# Patient Record
Sex: Female | Born: 1965 | Race: White | Hispanic: No | Marital: Married | Smoking: Never smoker
Health system: Southern US, Community
[De-identification: ages and names within clinical notes are randomized; demographics above are authoritative.]

## PROBLEM LIST (undated history)

## (undated) DIAGNOSIS — C50919 Malignant neoplasm of unspecified site of unspecified female breast: Secondary | ICD-10-CM

## (undated) HISTORY — PX: CHOLECYSTECTOMY: SHX55

## (undated) HISTORY — PX: APPENDECTOMY: SHX54

---

## 2021-11-17 ENCOUNTER — Emergency Department (HOSPITAL_COMMUNITY)
Admission: EM | Admit: 2021-11-17 | Discharge: 2021-11-17 | Disposition: A | Discharge status: Home / Self Care | Payer: BC Managed Care – PPO | Attending: Emergency Medicine | Admitting: Emergency Medicine

## 2021-11-17 ENCOUNTER — Emergency Department (HOSPITAL_COMMUNITY): Payer: BC Managed Care – PPO

## 2021-11-17 ENCOUNTER — Encounter (HOSPITAL_COMMUNITY): Payer: Self-pay | Admitting: Emergency Medicine

## 2021-11-17 DIAGNOSIS — D649 Anemia, unspecified: Secondary | ICD-10-CM | POA: Insufficient documentation

## 2021-11-17 DIAGNOSIS — K922 Gastrointestinal hemorrhage, unspecified: Secondary | ICD-10-CM | POA: Diagnosis not present

## 2021-11-17 DIAGNOSIS — J0100 Acute maxillary sinusitis, unspecified: Secondary | ICD-10-CM | POA: Diagnosis not present

## 2021-11-17 DIAGNOSIS — R519 Headache, unspecified: Secondary | ICD-10-CM

## 2021-11-17 HISTORY — DX: Malignant neoplasm of unspecified site of unspecified female breast: C50.919

## 2021-11-17 LAB — BASIC METABOLIC PANEL
Anion gap: 7 (ref 5–15)
BUN: 9 mg/dL (ref 6–20)
CO2: 25 mmol/L (ref 22–32)
Calcium: 9.2 mg/dL (ref 8.9–10.3)
Chloride: 108 mmol/L (ref 98–111)
Creatinine, Ser: 0.84 mg/dL (ref 0.44–1.00)
GFR, Estimated: >60 mL/min (ref >60)
Glucose, Bld: 100 mg/dL — ABNORMAL HIGH (ref 70–99)
Potassium: 3.8 mmol/L (ref 3.5–5.1)
Sodium: 140 mmol/L (ref 135–145)

## 2021-11-17 LAB — SEDIMENTATION RATE: Sed Rate: 9 mm/hr (ref 0–22)

## 2021-11-17 LAB — TYPE AND SCREEN
ABO/RH(D): O NEG
Antibody Screen: NEGATIVE

## 2021-11-17 LAB — CBC WITH DIFFERENTIAL/PLATELET
Abs Immature Granulocytes: 0.01 10*3/uL (ref 0.00–0.07)
Abs Immature Granulocytes: 0.01 10*3/uL (ref 0.00–0.07)
Basophils Absolute: 0.1 10*3/uL (ref 0.0–0.1)
Basophils Absolute: 0.1 10*3/uL (ref 0.0–0.1)
Basophils Relative: 2 %
Basophils Relative: 1 %
Eosinophils Absolute: 0.1 10*3/uL (ref 0.0–0.5)
Eosinophils Absolute: 0.1 10*3/uL (ref 0.0–0.5)
Eosinophils Relative: 4 %
Eosinophils Relative: 4 %
HCT: 23.7 % — ABNORMAL LOW (ref 36.0–46.0)
HCT: 24.9 % — ABNORMAL LOW (ref 36.0–46.0)
Hemoglobin: 6.4 g/dL — CL (ref 12.0–15.0)
Hemoglobin: 6.6 g/dL — CL (ref 12.0–15.0)
Immature Granulocytes: 0 %
Immature Granulocytes: 0 %
Lymphocytes Relative: 48 %
Lymphocytes Relative: 50 %
Lymphs Abs: 1.5 10*3/uL (ref 0.7–4.0)
Lymphs Abs: 1.8 10*3/uL (ref 0.7–4.0)
MCH: 17.7 pg — ABNORMAL LOW (ref 26.0–34.0)
MCH: 17.5 pg — ABNORMAL LOW (ref 26.0–34.0)
MCHC: 27 g/dL — ABNORMAL LOW (ref 30.0–36.0)
MCHC: 26.5 g/dL — ABNORMAL LOW (ref 30.0–36.0)
MCV: 65.7 fL — ABNORMAL LOW (ref 80.0–100.0)
MCV: 65.9 fL — ABNORMAL LOW (ref 80.0–100.0)
Monocytes Absolute: 0.3 10*3/uL (ref 0.1–1.0)
Monocytes Absolute: 0.3 10*3/uL (ref 0.1–1.0)
Monocytes Relative: 9 %
Monocytes Relative: 8 %
Neutro Abs: 1.2 10*3/uL — ABNORMAL LOW (ref 1.7–7.7)
Neutro Abs: 1.3 10*3/uL — ABNORMAL LOW (ref 1.7–7.7)
Neutrophils Relative %: 37 %
Neutrophils Relative %: 37 %
Platelets: 413 10*3/uL — ABNORMAL HIGH (ref 150–400)
Platelets: 417 10*3/uL — ABNORMAL HIGH (ref 150–400)
RBC: 3.61 MIL/uL — ABNORMAL LOW (ref 3.87–5.11)
RBC: 3.78 MIL/uL — ABNORMAL LOW (ref 3.87–5.11)
RDW: 19.9 % — ABNORMAL HIGH (ref 11.5–15.5)
RDW: 19.9 % — ABNORMAL HIGH (ref 11.5–15.5)
WBC: 3.2 10*3/uL — ABNORMAL LOW (ref 4.0–10.5)
WBC: 3.6 10*3/uL — ABNORMAL LOW (ref 4.0–10.5)
nRBC: 0 % (ref 0.0–0.2)
nRBC: 0 % (ref 0.0–0.2)

## 2021-11-17 LAB — RETICULOCYTES
Immature Retic Fract: 7.7 % (ref 2.3–15.9)
RBC.: 3.78 MIL/uL — ABNORMAL LOW (ref 3.87–5.11)
Retic Count, Absolute: 48.4 10*3/uL (ref 19.0–186.0)
Retic Ct Pct: 1.3 % (ref 0.4–3.1)

## 2021-11-17 LAB — C-REACTIVE PROTEIN: CRP: 0.6 mg/dL (ref <1.0)

## 2021-11-17 LAB — POC OCCULT BLOOD, ED: Fecal Occult Bld: POSITIVE — AB

## 2021-11-17 LAB — IRON AND TIBC
Iron: 8 ug/dL — ABNORMAL LOW (ref 28–170)
Saturation Ratios: 2 % — ABNORMAL LOW (ref 10.4–31.8)
TIBC: 451 ug/dL — ABNORMAL HIGH (ref 250–450)
UIBC: 443 ug/dL

## 2021-11-17 LAB — FOLATE: Folate: 12.2 ng/mL (ref >5.9)

## 2021-11-17 LAB — VITAMIN B12: Vitamin B-12: 277 pg/mL (ref 180–914)

## 2021-11-17 LAB — ABO/RH: ABO/RH(D): O NEG

## 2021-11-17 LAB — FERRITIN: Ferritin: 2 ng/mL — ABNORMAL LOW (ref 11–307)

## 2021-11-17 MED ORDER — AMOXICILLIN-POT CLAVULANATE 875-125 MG PO TABS: 1.0000 | ORAL_TABLET | Freq: Two times a day (BID) | ORAL | 0 refills | Status: DC

## 2021-11-17 MED ORDER — AMOXICILLIN-POT CLAVULANATE 875-125 MG PO TABS
1.0000 | ORAL_TABLET | Freq: Once | ORAL | Status: AC
  Administered 2021-11-17: 1 via ORAL
  Filled 2021-11-17: qty 1

## 2021-11-17 MED ORDER — SODIUM CHLORIDE 0.9 % IV SOLN
Freq: Once | INTRAVENOUS | Status: AC
Start: 2021-11-17 — End: 2021-11-17

## 2021-11-17 MED ORDER — PANTOPRAZOLE SODIUM 20 MG PO TBEC: 20.0000 mg | DELAYED_RELEASE_TABLET | Freq: Every day | ORAL | 0 refills | Status: AC

## 2021-11-17 MED ORDER — PANTOPRAZOLE SODIUM 20 MG PO TBEC
20.0000 mg | DELAYED_RELEASE_TABLET | Freq: Once | ORAL | Status: AC
Start: 2021-11-17 — End: 2021-11-17
  Administered 2021-11-17: 20 mg via ORAL
  Filled 2021-11-17: qty 1

## 2021-11-17 MED ORDER — IOHEXOL 350 MG/ML SOLN
75.0000 mL | Freq: Once | INTRAVENOUS | Status: AC | PRN
  Administered 2021-11-17: 75 mL via INTRAVENOUS

## 2021-11-17 NOTE — ED Provider Notes (Signed)
?[facility] ?Provider Note ? ? ?CSN: [id number] ?Arrival date & time: 11/17/21  1026 ? ?  ? ?History ? ?Chief Complaint  ?Patient presents with  ? Headache  ? ? ?Emma Rios is a 56 y.o. female. ? ?HPI ?Has history of treated breast cancer without recurrence.  She is otherwise healthy.  Patient which she has been having a pain in her head for several weeks.  She does not call it a headache but reports if she coughs or bends over, she will get a severe pain throughout her head.  Feels like a pressure.  It does not localize.  She denies any other associated symptoms.  Patient reports she has been taking ibuprofen for the headache.  Typically she takes no occasionally although over the past week she thinks she took more doses.  She denies any visual changes.  No blurred vision or double vision.  She denies any fevers or chills.  No sinus drainage.  No sore throat.  No neck stiffness.  Fevers no chills no malaise.  Patient reports she can do her usual activities without shortness of breath, weakness or incoordination.  No vomiting or diarrhea.  No chest pain.  Patient denies her bowel movement is dark or tarry.  No pain burning urgency with urination. ? ?Denies medical problems but does describe reflux symptoms and is scheduled for a screening colonoscopy as well as an upper endoscopy within the next 2 weeks. ?  ? ?Home Medications ?Prior to Admission medications   ?Medication Sig Start Date End Date Taking? Authorizing Provider  ?albuterol (VENTOLIN HFA) 108 (90 Base) MCG/ACT inhaler Inhale 2 puffs into the lungs every 6 (six) hours as needed for wheezing or shortness of breath. 10/07/21  Yes [provider]  ?amoxicillin-clavulanate (AUGMENTIN) 875-125 MG tablet Take 1 tablet by mouth every 12 (twelve) hours. 11/17/21  Yes Charlesetta Shanks, MD  ?cetirizine (ZYRTEC) 10 MG tablet Take 10 mg by mouth daily as needed for allergies or rhinitis.   Yes [provider]   ?pantoprazole (PROTONIX) 20 MG tablet Take 1 tablet (20 mg total) by mouth daily. 11/17/21  Yes Charlesetta Shanks, MD  ?pseudoephedrine (SUDAFED) 30 MG tablet Take 30 mg by mouth every 4 (four) hours as needed for congestion.   Yes [provider]  ?   ? ?Allergies    ?Codeine   ? ?Review of Systems   ?Review of Systems ?10 systems reviewed and negative except as per HPI ?Physical Exam ?Updated Vital Signs ?BP (!) 147/90   Pulse 86   Temp 98 ?F (36.7 ?C) (Oral)   Resp 15   LMP  (LMP Unknown)   SpO2 100%  ?Physical Exam ?Constitutional:   ?   Comments: Alert nontoxic and well in appearance.  Well-nourished well-developed.  ?HENT:  ?   Head: Normocephalic and atraumatic.  ?   Comments: No facial swelling. ?   Mouth/Throat:  ?   Mouth: Mucous membranes are moist.  ?   Pharynx: Oropharynx is clear.  ?Eyes:  ?   Extraocular Movements: Extraocular movements intact.  ?   Conjunctiva/sclera: Conjunctivae normal.  ?   Pupils: Pupils are equal, round, and reactive to light.  ?Cardiovascular:  ?   Rate and Rhythm: Normal rate and regular rhythm.  ?Pulmonary:  ?   Effort: Pulmonary effort is normal.  ?   Breath sounds: Normal breath sounds.  ?Abdominal:  ?   General: There is no distension.  ?   Palpations: Abdomen  is soft.  ?   Tenderness: There is no abdominal tenderness. There is no guarding.  ?Genitourinary: ?   Comments: Rectal: Trace brown stool in the vault.  No melena. ?Musculoskeletal:     ?   General: No swelling or tenderness. Normal range of motion.  ?   Cervical back: Neck supple.  ?   Right lower leg: No edema.  ?   Left lower leg: No edema.  ?Skin: ?   General: Skin is warm and dry.  ?Neurological:  ?   General: No focal deficit present.  ?   Mental Status: She is oriented to person, place, and time.  ?   Cranial Nerves: No cranial nerve deficit.  ?   Motor: No weakness.  ?   Coordination: Coordination normal.  ?Psychiatric:     ?   Mood and Affect: Mood normal.  ? ? ?ED Results / Procedures /  Treatments   ?Labs ?(all labs ordered are listed, but only abnormal results are displayed) ?Labs Reviewed  ?CBC WITH DIFFERENTIAL/PLATELET - Abnormal; Notable for the following components:  ?    Result Value  ? WBC 3.2 (*)   ? RBC 3.61 (*)   ? Hemoglobin 6.4 (*)   ? HCT 23.7 (*)   ? MCV 65.7 (*)   ? MCH 17.7 (*)   ? MCHC 27.0 (*)   ? RDW 19.9 (*)   ? Platelets 413 (*)   ? Neutro Abs 1.2 (*)   ? All other components within normal limits  ?BASIC METABOLIC PANEL - Abnormal; Notable for the following components:  ? Glucose, Bld 100 (*)   ? All other components within normal limits  ?CBC WITH DIFFERENTIAL/PLATELET - Abnormal; Notable for the following components:  ? WBC 3.6 (*)   ? RBC 3.78 (*)   ? Hemoglobin 6.6 (*)   ? HCT 24.9 (*)   ? MCV 65.9 (*)   ? MCH 17.5 (*)   ? MCHC 26.5 (*)   ? RDW 19.9 (*)   ? Platelets 417 (*)   ? Neutro Abs 1.3 (*)   ? All other components within normal limits  ?IRON AND TIBC - Abnormal; Notable for the following components:  ? Iron 8 (*)   ? TIBC 451 (*)   ? Saturation Ratios 2 (*)   ? All other components within normal limits  ?FERRITIN - Abnormal; Notable for the following components:  ? Ferritin 2 (*)   ? All other components within normal limits  ?RETICULOCYTES - Abnormal; Notable for the following components:  ? RBC. 3.78 (*)   ? All other components within normal limits  ?POC OCCULT BLOOD, ED - Abnormal; Notable for the following components:  ? Fecal Occult Bld POSITIVE (*)   ? All other components within normal limits  ?SEDIMENTATION RATE  ?C-REACTIVE PROTEIN  ?VITAMIN B12  ?FOLATE  ?TYPE AND SCREEN  ?ABO/RH  ? ? ?EKG ?None ? ?Radiology ?CT Angio Head W or Wo Contrast ? ?Result Date: 11/17/2021 ?CLINICAL DATA:  Headache, new or worsening. Symptoms began about 2 weeks ago. EXAM: CT ANGIOGRAPHY HEAD TECHNIQUE: Multidetector CT imaging of the head was performed using the standard protocol during bolus administration of intravenous contrast. Multiplanar CT image reconstructions and MIPs  were obtained to evaluate the vascular anatomy. RADIATION DOSE REDUCTION: This exam was performed according to the departmental dose-optimization program which includes automated exposure control, adjustment of the mA and/or kV according to patient size and/or use of iterative reconstruction technique. CONTRAST:  30m OMNIPAQUE IOHEXOL 350 MG/ML SOLN COMPARISON:  Head CT earlier same day FINDINGS: CTA HEAD Anterior circulation: Both internal carotid arteries are patent through the skull base and siphon regions. The anterior and middle cerebral vessels are patent and normal in appearance. No evidence of large vessel occlusion, aneurysm or stenosis. Posterior circulation: Both vertebral arteries are patent through the foramen magnum to the basilar. No basilar stenosis. Posterior circulation branch vessels are normal. Venous sinuses: Normal as seen.  See results of venography. Anatomic variants: None Near complete opacification of the right maxillary sinus as seen previously. Review of the MIP images confirms the above findings. IMPRESSION: Normal intracranial CT arterial angiography. Near complete opacification of the right maxillary sinus. Electronically Signed   By: MNelson ChimesM.D.   On: 11/17/2021 18:45  ? ?CT Head Wo Contrast ? ?Result Date: 11/17/2021 ?CLINICAL DATA:  Severe headache, history of breast carcinoma EXAM: CT HEAD WITHOUT CONTRAST TECHNIQUE: Contiguous axial images were obtained from the base of the skull through the vertex without intravenous contrast. RADIATION DOSE REDUCTION: This exam was performed according to the departmental dose-optimization program which includes automated exposure control, adjustment of the mA and/or kV according to patient size and/or use of iterative reconstruction technique. COMPARISON:  None Available. FINDINGS: Brain: No acute intracranial findings are seen. There are no signs of bleeding. There are no epidural or subdural fluid collections. Ventricles are not dilated.  There is no shift of midline structures. Vascular: Unremarkable. Skull: Unremarkable. Sinuses/Orbits: There is almost complete opacification of right maxillary sinus. There is mild mucosal thickening in the ethmoid sinus

## 2021-11-17 NOTE — Discharge Instructions (Signed)
1.  Take Augmentin twice daily as prescribed for sinus infection.  Take Protonix every day.  Do not take any anti-inflammatory medications in the category of NSAIDs or aspirin.  If you need something for pain take extra strength Tylenol every 6 hours.  You may choose an over-the-counter iron supplement to take daily. ?2.  Call Dr.Feng's office tomorrow morning to schedule your follow-up appointment within the next 1 to 5 days. ?3.  You have very low blood count.  Return to the emergency department immediately if you feel short of breath, lightheaded, your stool looks black or cranberry colored or any other concerning changes. ?

## 2021-11-17 NOTE — ED Provider Triage Note (Signed)
Emergency Medicine Provider Triage Evaluation Note ? ?Emma Rios , a 56 y.o. female  was evaluated in triage.  Pt complains of a global headache primarily with movement of the head and with bearing down for the last 2 weeks. Patient does carry a history of breast cancer 15 years ago that was treated with chemo and radiation. Patient has no headache when sitting still.  ? ?Review of Systems  ?Positive: Headache  ?Negative: Nausea, vomiting, fever, chills, focal weakness or numbness  ? ?Physical Exam  ?BP (!) 137/95 (BP Location: Right Arm)   Pulse 94   Temp 98 ?F (36.7 ?C) (Oral)   Resp 16   LMP  (LMP Unknown)   SpO2 100%  ?Gen:   Awake, no distress   ?Resp:  Normal effort  ?MSK:   Moves extremities without difficulty  ?Other:  5/5 strength to the upper and lower extremities. Normal sensation to the upper and lower extremities. No pronator drift. PERRL. EOMI without nystagmus or entrapment. No dysmetria with finger to nose.  ? ?Medical Decision Making  ?Medically screening exam initiated at 10:50 AM.  Appropriate orders placed.  Emma Rios was informed that the remainder of the evaluation will be completed by another provider, this initial triage assessment does not replace that evaluation, and the importance of remaining in the ED until their evaluation is complete. ? ? ?  ?Hendricks Limes, PA-C ?11/17/21 1053 ? ?

## 2021-11-17 NOTE — ED Notes (Signed)
Lab called critical result HGB 6.6. md notified ? ?

## 2021-11-17 NOTE — ED Triage Notes (Signed)
Pt. Stated, about 2 weeks I started having HEAD pain not headache. When I I cough or jar my head it will be a 8-9. Im a breast cancer survivor. ?

## 2021-11-18 ENCOUNTER — Telehealth: Payer: Self-pay | Admitting: Nurse Practitioner

## 2021-11-18 ENCOUNTER — Other Ambulatory Visit: Payer: Self-pay | Admitting: Hematology

## 2021-11-18 DIAGNOSIS — D500 Iron deficiency anemia secondary to blood loss (chronic): Secondary | ICD-10-CM

## 2021-11-18 NOTE — Telephone Encounter (Signed)
Scheduled appt per 5/5 staff msg from Dr. Burr Medico. Emma Rios is aware of appt date and time. Emma Rios is aware to arrive 15 mins prior to appt time and to bring and updated insurance card. Emma Rios is aware of appt location.   ?

## 2021-11-23 ENCOUNTER — Inpatient Hospital Stay: Payer: BC Managed Care – PPO | Attending: Nurse Practitioner | Admitting: Hematology

## 2021-11-23 ENCOUNTER — Inpatient Hospital Stay: Payer: BC Managed Care – PPO

## 2021-11-23 ENCOUNTER — Other Ambulatory Visit: Payer: Self-pay

## 2021-11-23 ENCOUNTER — Encounter: Payer: Self-pay | Admitting: Hematology

## 2021-11-23 VITALS — BP 113/80 | HR 78 | Temp 97.9°F | Resp 18

## 2021-11-23 VITALS — BP 132/86 | HR 85 | Temp 97.5°F | Resp 19 | Ht 66.5 in | Wt 154.9 lb

## 2021-11-23 DIAGNOSIS — R059 Cough, unspecified: Secondary | ICD-10-CM | POA: Insufficient documentation

## 2021-11-23 DIAGNOSIS — Z853 Personal history of malignant neoplasm of breast: Secondary | ICD-10-CM | POA: Insufficient documentation

## 2021-11-23 DIAGNOSIS — Z9049 Acquired absence of other specified parts of digestive tract: Secondary | ICD-10-CM | POA: Diagnosis not present

## 2021-11-23 DIAGNOSIS — D500 Iron deficiency anemia secondary to blood loss (chronic): Secondary | ICD-10-CM

## 2021-11-23 DIAGNOSIS — D509 Iron deficiency anemia, unspecified: Secondary | ICD-10-CM | POA: Insufficient documentation

## 2021-11-23 DIAGNOSIS — Z885 Allergy status to narcotic agent status: Secondary | ICD-10-CM | POA: Diagnosis not present

## 2021-11-23 DIAGNOSIS — Z79899 Other long term (current) drug therapy: Secondary | ICD-10-CM | POA: Insufficient documentation

## 2021-11-23 DIAGNOSIS — Z923 Personal history of irradiation: Secondary | ICD-10-CM | POA: Diagnosis not present

## 2021-11-23 DIAGNOSIS — Z8042 Family history of malignant neoplasm of prostate: Secondary | ICD-10-CM | POA: Diagnosis not present

## 2021-11-23 DIAGNOSIS — R519 Headache, unspecified: Secondary | ICD-10-CM | POA: Diagnosis not present

## 2021-11-23 DIAGNOSIS — Z809 Family history of malignant neoplasm, unspecified: Secondary | ICD-10-CM | POA: Insufficient documentation

## 2021-11-23 MED ORDER — SODIUM CHLORIDE 0.9 % IV SOLN
300.0000 mg | Freq: Once | INTRAVENOUS | Status: AC
  Administered 2021-11-23: 300 mg via INTRAVENOUS
  Filled 2021-11-23: qty 300

## 2021-11-23 MED ORDER — LORATADINE 10 MG PO TABS
10.0000 mg | ORAL_TABLET | Freq: Every day | ORAL | Status: DC
  Administered 2021-11-23: 10 mg via ORAL
  Filled 2021-11-23: qty 1

## 2021-11-23 MED ORDER — SODIUM CHLORIDE 0.9 % IV SOLN: Freq: Once | INTRAVENOUS | Status: AC

## 2021-11-23 NOTE — Progress Notes (Addendum)
[facility]   [phone] [phone]   Clinic New consult Note   Patient Care Team: Pcp, No as PCP - General Malachy Mood, MD as Consulting Physician (Hematology) 11/23/2021  CHIEF COMPLAINTS/PURPOSE OF CONSULTATION:  Iron Deficient Anemia  ASSESSMENT & PLAN:  Emma Rios is a 56 y.o. postmenopausal female with  1. Iron Deficient Anemia -she initially presented to ED on 11/17/21 with head pain triggered by movement. CBC showed hgb 6.4, iron 8, and ferritin 2. Fecal occult blood test was also positive. -she is coincidentally already scheduled for screening endo/colonoscopy with Dr. Iona Coach at [facility] on 11/30/21. -I discussed IV iron infusions for quick response to improve her blood counts. She is scheduled for first IV Venofer today, and we will give two more doses in next 2-3 weeks. -She is postmenopausal, if her endoscopy is negative, plan to order CT chest, abdomen pelvis, rule out malignancy.  2. H/o Left Breast Cancer in 2007, stage IIA T2, N0, IDC, grade 2 -s/p lumpectomy on 10/16/05, adjuvant chemo with taxol/cytoxan through 01/2007, adjuvant radiation, and 9 years of tamoxifen through 04/2016.   PLAN: -proceed with IV Venofer today -lab and IV Venofer 400mg  weekly x2 in next 2-3 weeks  -lab and f/u with NP in 3 weeks. If her GI endoscopy is negative, plan to order CT scan to rule out malignancy.   HISTORY OF PRESENTING ILLNESS:  Emma Rios 56 y.o. female is here because of iron deficient anemia. She initially presented with pain to her brain. She explains it's not a headache but is a pain that occurs when her brain is jostled (such as with coughing, bending over, etc). She denies neck pain. She notes a history of migraines, so she adds she knows what a headache feels like.  She was found to have abnormal CBC from 11/17/21. Prior CBC in 05/2020 was WNL. She denies recent chest pain on exertion, shortness of breath on minimal exertion, pre-syncopal  episodes, or palpitations. She had not noticed any recent bleeding such as epistaxis, hematuria or hematochezia The patient was previously using over-the-counter ibuprofen for her headaches until ED visit. She is not on anticoagulants. She is scheduled for endoscopy and colonoscopy with Dr. Iona Coach this month. She reports a history of breast cancer in 2008, treated at [facility]. She reports she completed 9 years of tamoxifen. She denies any pica and eats a variety of diet.   REVIEW OF SYSTEMS:   Constitutional: Denies fevers, chills or abnormal night sweats Eyes: Denies blurriness of vision, double vision or watery eyes Ears, nose, mouth, throat, and face: Denies mucositis or sore throat Respiratory: Denies cough, dyspnea or wheezes Cardiovascular: Denies palpitation, chest discomfort or lower extremity swelling Gastrointestinal:  Denies nausea, heartburn or change in bowel habits Skin: Denies abnormal skin rashes Lymphatics: Denies new lymphadenopathy or easy bruising Neurological:Denies numbness, tingling or new weaknesses Behavioral/Psych: Mood is stable, no new changes   All other systems were reviewed with the patient and are negative.   MEDICAL HISTORY:  Past Medical History:  Diagnosis Date   Breast cancer (HCC)     SURGICAL HISTORY: Past Surgical History:  Procedure Laterality Date   APPENDECTOMY     CHOLECYSTECTOMY      SOCIAL HISTORY: Social History   Socioeconomic History   Marital status: Married    Spouse name: Not on file   Number of children: 3   Years of education: Not on file   Highest education level: Not on file  Occupational History  Not on file  Tobacco Use   Smoking status: Never   Smokeless tobacco: Never  Substance and Sexual Activity   Alcohol use: Not Currently   Drug use: Not Currently   Sexual activity: Not on file  Other Topics Concern   Not on file  Social History Narrative   Not on file   Social Determinants of Health    Financial Resource Strain: Not on file  Food Insecurity: Not on file  Transportation Needs: Not on file  Physical Activity: Not on file  Stress: Not on file  Social Connections: Not on file  Intimate Partner Violence: Not on file    FAMILY HISTORY: Family History  Problem Relation Age of Onset   Cancer Maternal Grandmother        metastatic cancer   Cancer Paternal Grandfather        prostate cancer    ALLERGIES:  is allergic to codeine.  MEDICATIONS:  Current Outpatient Medications  Medication Sig Dispense Refill   albuterol (VENTOLIN HFA) 108 (90 Base) MCG/ACT inhaler Inhale 2 puffs into the lungs every 6 (six) hours as needed for wheezing or shortness of breath.     amoxicillin-clavulanate (AUGMENTIN) 875-125 MG tablet Take 1 tablet by mouth every 12 (twelve) hours. 14 tablet 0   cetirizine (ZYRTEC) 10 MG tablet Take 10 mg by mouth daily as needed for allergies or rhinitis.     pantoprazole (PROTONIX) 20 MG tablet Take 1 tablet (20 mg total) by mouth daily. 30 tablet 0   pseudoephedrine (SUDAFED) 30 MG tablet Take 30 mg by mouth every 4 (four) hours as needed for congestion.     No current facility-administered medications for this visit.    PHYSICAL EXAMINATION: ECOG PERFORMANCE STATUS: 1 - Symptomatic but completely ambulatory  Vitals:   11/23/21 1057  BP: 132/86  Pulse: 85  Resp: 19  Temp: (!) 97.5 F (36.4 C)  SpO2: 100%   Filed Weights   11/23/21 1057  Weight: 154 lb 14.4 oz (70.3 kg)    GENERAL:alert, no distress and comfortable SKIN: skin color, texture, turgor are normal, no rashes or significant lesions EYES: normal, conjunctiva are pink and non-injected, sclera clear OROPHARYNX:no exudate, no erythema and lips, buccal mucosa, and tongue normal  NECK: supple, thyroid normal size, non-tender, without nodularity LYMPH:  no palpable lymphadenopathy in the cervical, axillary or inguinal LUNGS: clear to auscultation and percussion with normal  breathing effort HEART: regular rate & rhythm and no murmurs and no lower extremity edema ABDOMEN:abdomen soft, non-tender and normal bowel sounds Musculoskeletal:no cyanosis of digits and no clubbing  PSYCH: alert & oriented x 3 with fluent speech NEURO: no focal motor/sensory deficits  LABORATORY DATA:  I have reviewed the data as listed    Latest Ref Rng & Units 11/17/2021    4:33 PM 11/17/2021   10:56 AM  CBC  WBC 4.0 - 10.5 K/uL 3.6   3.2    Hemoglobin 12.0 - 15.0 g/dL 6.6   6.4    Hematocrit 36.0 - 46.0 % 24.9   23.7    Platelets 150 - 400 K/uL 417   413         Latest Ref Rng & Units 11/17/2021   10:56 AM  CMP  Glucose 70 - 99 mg/dL 811    BUN 6 - 20 mg/dL 9    Creatinine 9.14 - 1.00 mg/dL 7.82    Sodium 956 - 213 mmol/L 140    Potassium 3.5 - 5.1 mmol/L 3.8  Chloride 98 - 111 mmol/L 108    CO2 22 - 32 mmol/L 25    Calcium 8.9 - 10.3 mg/dL 9.2       RADIOGRAPHIC STUDIES: I have personally reviewed the radiological images as listed and agreed with the findings in the report. CT Angio Head W or Wo Contrast  Result Date: 11/17/2021 CLINICAL DATA:  Headache, new or worsening. Symptoms began about 2 weeks ago. EXAM: CT ANGIOGRAPHY HEAD TECHNIQUE: Multidetector CT imaging of the head was performed using the standard protocol during bolus administration of intravenous contrast. Multiplanar CT image reconstructions and MIPs were obtained to evaluate the vascular anatomy. RADIATION DOSE REDUCTION: This exam was performed according to the departmental dose-optimization program which includes automated exposure control, adjustment of the mA and/or kV according to patient size and/or use of iterative reconstruction technique. CONTRAST:  75mL OMNIPAQUE IOHEXOL 350 MG/ML SOLN COMPARISON:  Head CT earlier same day FINDINGS: CTA HEAD Anterior circulation: Both internal carotid arteries are patent through the skull base and siphon regions. The anterior and middle cerebral vessels are patent  and normal in appearance. No evidence of large vessel occlusion, aneurysm or stenosis. Posterior circulation: Both vertebral arteries are patent through the foramen magnum to the basilar. No basilar stenosis. Posterior circulation branch vessels are normal. Venous sinuses: Normal as seen.  See results of venography. Anatomic variants: None Near complete opacification of the right maxillary sinus as seen previously. Review of the MIP images confirms the above findings. IMPRESSION: Normal intracranial CT arterial angiography. Near complete opacification of the right maxillary sinus. Electronically Signed   By: Paulina Fusi M.D.   On: 11/17/2021 18:45   CT Head Wo Contrast  Result Date: 11/17/2021 CLINICAL DATA:  Severe headache, history of breast carcinoma EXAM: CT HEAD WITHOUT CONTRAST TECHNIQUE: Contiguous axial images were obtained from the base of the skull through the vertex without intravenous contrast. RADIATION DOSE REDUCTION: This exam was performed according to the departmental dose-optimization program which includes automated exposure control, adjustment of the mA and/or kV according to patient size and/or use of iterative reconstruction technique. COMPARISON:  None Available. FINDINGS: Brain: No acute intracranial findings are seen. There are no signs of bleeding. There are no epidural or subdural fluid collections. Ventricles are not dilated. There is no shift of midline structures. Vascular: Unremarkable. Skull: Unremarkable. Sinuses/Orbits: There is almost complete opacification of right maxillary sinus. There is mild mucosal thickening in the ethmoid sinus. Other: None IMPRESSION: No acute intracranial findings are seen in noncontrast CT brain. Chronic ethmoid and right maxillary sinusitis. Electronically Signed   By: Ernie Avena M.D.   On: 11/17/2021 12:57   CT VENOGRAM HEAD  Result Date: 11/17/2021 CLINICAL DATA:  Headache.  Dural venous sinus thrombosis suspected. EXAM: CT VENOGRAM  HEAD TECHNIQUE: Venographic phase images of the brain were obtained following the administration of intravenous contrast. Multiplanar reformats and maximum intensity projections were generated. RADIATION DOSE REDUCTION: This exam was performed according to the departmental dose-optimization program which includes automated exposure control, adjustment of the mA and/or kV according to patient size and/or use of iterative reconstruction technique. CONTRAST:  75mL OMNIPAQUE IOHEXOL 350 MG/ML SOLN COMPARISON:  Head CT same day. Arterial angiographic study same day. FINDINGS: Superior sagittal sinus is widely patent. Both transverse and sigmoid sinuses are patent and normal. Both jugular veins show flow. Deep veins appear normal. No visible cortical thrombosis. IMPRESSION: Negative intracranial CT venography. Electronically Signed   By: Paulina Fusi M.D.   On: 11/17/2021 18:46  No orders of the defined types were placed in this encounter.   All questions were answered. The patient knows to call the clinic with any problems, questions or concerns. The total time spent in the appointment was 45 minutes.     Malachy Mood, MD 11/23/2021 10:02 PM  I, Mickie Bail, am acting as scribe for Malachy Mood, MD.   I have reviewed the above documentation for accuracy and completeness, and I agree with the above.

## 2021-11-23 NOTE — Progress Notes (Signed)
Pt observed for 30 minutes post Venofer infusion. No complaints. Vital signs stable at time of discharge.  ?

## 2021-11-23 NOTE — Patient Instructions (Signed)

## 2021-11-25 ENCOUNTER — Other Ambulatory Visit: Payer: Self-pay

## 2021-11-25 DIAGNOSIS — D500 Iron deficiency anemia secondary to blood loss (chronic): Secondary | ICD-10-CM

## 2021-11-28 ENCOUNTER — Inpatient Hospital Stay: Payer: BC Managed Care – PPO

## 2021-11-28 ENCOUNTER — Other Ambulatory Visit: Payer: Self-pay

## 2021-11-28 VITALS — BP 153/90 | HR 95 | Temp 98.2°F | Resp 18 | Wt 155.8 lb

## 2021-11-28 DIAGNOSIS — D500 Iron deficiency anemia secondary to blood loss (chronic): Secondary | ICD-10-CM

## 2021-11-28 DIAGNOSIS — D509 Iron deficiency anemia, unspecified: Secondary | ICD-10-CM | POA: Diagnosis not present

## 2021-11-28 LAB — CBC WITH DIFFERENTIAL (CANCER CENTER ONLY)
Abs Immature Granulocytes: 0.01 10*3/uL (ref 0.00–0.07)
Basophils Absolute: 0.1 10*3/uL (ref 0.0–0.1)
Basophils Relative: 1 %
Eosinophils Absolute: 0.2 10*3/uL (ref 0.0–0.5)
Eosinophils Relative: 5 %
HCT: 25.9 % — ABNORMAL LOW (ref 36.0–46.0)
Hemoglobin: 7.2 g/dL — ABNORMAL LOW (ref 12.0–15.0)
Immature Granulocytes: 0 %
Lymphocytes Relative: 40 %
Lymphs Abs: 1.8 10*3/uL (ref 0.7–4.0)
MCH: 19.3 pg — ABNORMAL LOW (ref 26.0–34.0)
MCHC: 27.8 g/dL — ABNORMAL LOW (ref 30.0–36.0)
MCV: 69.3 fL — ABNORMAL LOW (ref 80.0–100.0)
Monocytes Absolute: 0.3 10*3/uL (ref 0.1–1.0)
Monocytes Relative: 8 %
Neutro Abs: 2 10*3/uL (ref 1.7–7.7)
Neutrophils Relative %: 46 %
Platelet Count: 379 10*3/uL (ref 150–400)
RBC: 3.74 MIL/uL — ABNORMAL LOW (ref 3.87–5.11)
RDW: 25.7 % — ABNORMAL HIGH (ref 11.5–15.5)
WBC Count: 4.4 10*3/uL (ref 4.0–10.5)
nRBC: 0 % (ref 0.0–0.2)

## 2021-11-28 LAB — CMP (CANCER CENTER ONLY)
ALT: 11 U/L (ref 0–44)
AST: 14 U/L — ABNORMAL LOW (ref 15–41)
Albumin: 4.1 g/dL (ref 3.5–5.0)
Alkaline Phosphatase: 71 U/L (ref 38–126)
Anion gap: 8 (ref 5–15)
BUN: 16 mg/dL (ref 6–20)
CO2: 25 mmol/L (ref 22–32)
Calcium: 8.8 mg/dL — ABNORMAL LOW (ref 8.9–10.3)
Chloride: 106 mmol/L (ref 98–111)
Creatinine: 1.03 mg/dL — ABNORMAL HIGH (ref 0.44–1.00)
GFR, Estimated: >60 mL/min (ref >60)
Glucose, Bld: 90 mg/dL (ref 70–99)
Potassium: 3.6 mmol/L (ref 3.5–5.1)
Sodium: 139 mmol/L (ref 135–145)
Total Bilirubin: 0.3 mg/dL (ref 0.3–1.2)
Total Protein: 6.7 g/dL (ref 6.5–8.1)

## 2021-11-28 LAB — IRON AND IRON BINDING CAPACITY (CC-WL,HP ONLY)
Iron: 26 ug/dL — ABNORMAL LOW (ref 28–170)
Saturation Ratios: 6 % — ABNORMAL LOW (ref 10.4–31.8)
TIBC: 442 ug/dL (ref 250–450)
UIBC: 416 ug/dL (ref 148–442)

## 2021-11-28 MED ORDER — SODIUM CHLORIDE 0.9 % IV SOLN
300.0000 mg | Freq: Once | INTRAVENOUS | Status: AC
  Administered 2021-11-28: 300 mg via INTRAVENOUS
  Filled 2021-11-28: qty 300

## 2021-11-28 MED ORDER — LORATADINE 10 MG PO TABS
10.0000 mg | ORAL_TABLET | Freq: Once | ORAL | Status: AC
  Administered 2021-11-28: 10 mg via ORAL
  Filled 2021-11-28: qty 1

## 2021-11-28 MED ORDER — SODIUM CHLORIDE 0.9 % IV SOLN: Freq: Once | INTRAVENOUS | Status: AC

## 2021-11-28 NOTE — Progress Notes (Signed)
Client declined to stay for 30 minute post-iron observation. VSS, ambulatory to lobby. ?

## 2021-11-29 LAB — FERRITIN: Ferritin: 71 ng/mL (ref 11–307)

## 2021-11-30 ENCOUNTER — Telehealth: Payer: Self-pay | Admitting: Hematology

## 2021-11-30 NOTE — Telephone Encounter (Signed)
Scheduled follow-up appointment per 5/10 los. Patient is aware. ?

## 2021-12-08 ENCOUNTER — Inpatient Hospital Stay: Payer: BC Managed Care – PPO

## 2021-12-08 ENCOUNTER — Other Ambulatory Visit: Payer: Self-pay

## 2021-12-08 VITALS — BP 117/89 | HR 77 | Temp 98.1°F | Resp 16

## 2021-12-08 DIAGNOSIS — D500 Iron deficiency anemia secondary to blood loss (chronic): Secondary | ICD-10-CM

## 2021-12-08 DIAGNOSIS — D509 Iron deficiency anemia, unspecified: Secondary | ICD-10-CM | POA: Diagnosis not present

## 2021-12-08 LAB — CBC WITH DIFFERENTIAL (CANCER CENTER ONLY)
Abs Immature Granulocytes: 0.01 10*3/uL (ref 0.00–0.07)
Basophils Absolute: 0.1 10*3/uL (ref 0.0–0.1)
Basophils Relative: 3 %
Eosinophils Absolute: 0.1 10*3/uL (ref 0.0–0.5)
Eosinophils Relative: 4 %
HCT: 32.6 % — ABNORMAL LOW (ref 36.0–46.0)
Hemoglobin: 9.5 g/dL — ABNORMAL LOW (ref 12.0–15.0)
Immature Granulocytes: 0 %
Lymphocytes Relative: 48 %
Lymphs Abs: 1.3 10*3/uL (ref 0.7–4.0)
MCH: 21.5 pg — ABNORMAL LOW (ref 26.0–34.0)
MCHC: 29.1 g/dL — ABNORMAL LOW (ref 30.0–36.0)
MCV: 73.8 fL — ABNORMAL LOW (ref 80.0–100.0)
Monocytes Absolute: 0.2 10*3/uL (ref 0.1–1.0)
Monocytes Relative: 8 %
Neutro Abs: 1 10*3/uL — ABNORMAL LOW (ref 1.7–7.7)
Neutrophils Relative %: 37 %
Platelet Count: 293 10*3/uL (ref 150–400)
RBC: 4.42 MIL/uL (ref 3.87–5.11)
RDW: 29.2 % — ABNORMAL HIGH (ref 11.5–15.5)
WBC Count: 2.7 10*3/uL — ABNORMAL LOW (ref 4.0–10.5)
nRBC: 0 % (ref 0.0–0.2)

## 2021-12-08 LAB — CMP (CANCER CENTER ONLY)
ALT: 12 U/L (ref 0–44)
AST: 14 U/L — ABNORMAL LOW (ref 15–41)
Albumin: 4.4 g/dL (ref 3.5–5.0)
Alkaline Phosphatase: 75 U/L (ref 38–126)
Anion gap: 7 (ref 5–15)
BUN: 11 mg/dL (ref 6–20)
CO2: 26 mmol/L (ref 22–32)
Calcium: 9.5 mg/dL (ref 8.9–10.3)
Chloride: 107 mmol/L (ref 98–111)
Creatinine: 0.92 mg/dL (ref 0.44–1.00)
GFR, Estimated: >60 mL/min (ref >60)
Glucose, Bld: 103 mg/dL — ABNORMAL HIGH (ref 70–99)
Potassium: 3.9 mmol/L (ref 3.5–5.1)
Sodium: 140 mmol/L (ref 135–145)
Total Bilirubin: 0.4 mg/dL (ref 0.3–1.2)
Total Protein: 7.2 g/dL (ref 6.5–8.1)

## 2021-12-08 MED ORDER — SODIUM CHLORIDE 0.9 % IV SOLN: Freq: Once | INTRAVENOUS | Status: AC

## 2021-12-08 MED ORDER — SODIUM CHLORIDE 0.9 % IV SOLN
300.0000 mg | Freq: Once | INTRAVENOUS | Status: AC
  Administered 2021-12-08: 300 mg via INTRAVENOUS
  Filled 2021-12-08: qty 300

## 2021-12-08 MED ORDER — LORATADINE 10 MG PO TABS
10.0000 mg | ORAL_TABLET | Freq: Once | ORAL | Status: AC
  Administered 2021-12-08: 10 mg via ORAL
  Filled 2021-12-08: qty 1

## 2021-12-08 NOTE — Patient Instructions (Signed)

## 2021-12-09 ENCOUNTER — Other Ambulatory Visit: Payer: Self-pay | Admitting: Hematology

## 2021-12-09 DIAGNOSIS — D500 Iron deficiency anemia secondary to blood loss (chronic): Secondary | ICD-10-CM

## 2021-12-13 ENCOUNTER — Telehealth: Payer: Self-pay

## 2021-12-13 NOTE — Telephone Encounter (Signed)
Pt called stating she is having brain pain and would like to know why she's having brain pain.  Pt stated she had discussed this with Dr. Burr Medico but has not found any relief with Dr. Ernestina Penna recommendation.  Pt called wanting to some relief because she's not able to get any sleep.  Pt is scheduled to see Dr. Chryl Heck on 12/21/2021.  Informed pt that this RN will notify a provider of her symptoms.

## 2021-12-14 ENCOUNTER — Encounter: Payer: Self-pay | Admitting: Hematology

## 2021-12-14 ENCOUNTER — Telehealth: Payer: Self-pay

## 2021-12-14 NOTE — Telephone Encounter (Signed)
Pt called again to know wanting to know if anyone is going to give her a call back regarding her "brain" pain.  Informed pt that the message from 12/13/2021 was passed onto a provider to f/u with her.  Pt stated she has not received a call.  Informed pt that this RN will notify the provider again.  Sent notifications Cira Rue, NP and Lisabeth Devoid, PA-C in [facility].

## 2021-12-15 ENCOUNTER — Telehealth: Payer: Self-pay | Admitting: Physician Assistant

## 2021-12-15 NOTE — Telephone Encounter (Signed)
Scheduled per 6/1 in basket, pt confirmed appt for tomorrow(6/2) she is out of town today

## 2021-12-16 ENCOUNTER — Encounter: Payer: Self-pay | Admitting: Physician Assistant

## 2021-12-16 ENCOUNTER — Ambulatory Visit (HOSPITAL_COMMUNITY)
Admission: RE | Admit: 2021-12-16 | Discharge: 2021-12-16 | Disposition: A | Discharge status: Home / Self Care | Payer: BC Managed Care – PPO | Source: Ambulatory Visit | Attending: Physician Assistant | Admitting: Physician Assistant

## 2021-12-16 ENCOUNTER — Other Ambulatory Visit: Payer: Self-pay

## 2021-12-16 ENCOUNTER — Inpatient Hospital Stay: Payer: BC Managed Care – PPO | Attending: Nurse Practitioner | Admitting: Physician Assistant

## 2021-12-16 VITALS — BP 137/100 | HR 82 | Temp 97.7°F | Resp 16 | Wt 157.2 lb

## 2021-12-16 DIAGNOSIS — R2990 Unspecified symptoms and signs involving the nervous system: Secondary | ICD-10-CM | POA: Diagnosis not present

## 2021-12-16 DIAGNOSIS — D649 Anemia, unspecified: Secondary | ICD-10-CM

## 2021-12-16 LAB — CBC WITH DIFFERENTIAL (CANCER CENTER ONLY)
Abs Immature Granulocytes: 0.01 10*3/uL (ref 0.00–0.07)
Basophils Absolute: 0.1 10*3/uL (ref 0.0–0.1)
Basophils Relative: 2 %
Eosinophils Absolute: 0.1 10*3/uL (ref 0.0–0.5)
Eosinophils Relative: 3 %
HCT: 36.8 % (ref 36.0–46.0)
Hemoglobin: 10.9 g/dL — ABNORMAL LOW (ref 12.0–15.0)
Immature Granulocytes: 0 %
Lymphocytes Relative: 41 %
Lymphs Abs: 1.7 10*3/uL (ref 0.7–4.0)
MCH: 22.2 pg — ABNORMAL LOW (ref 26.0–34.0)
MCHC: 29.6 g/dL — ABNORMAL LOW (ref 30.0–36.0)
MCV: 74.8 fL — ABNORMAL LOW (ref 80.0–100.0)
Monocytes Absolute: 0.3 10*3/uL (ref 0.1–1.0)
Monocytes Relative: 8 %
Neutro Abs: 1.9 10*3/uL (ref 1.7–7.7)
Neutrophils Relative %: 46 %
Platelet Count: 358 10*3/uL (ref 150–400)
RBC: 4.92 MIL/uL (ref 3.87–5.11)
RDW: 30.2 % — ABNORMAL HIGH (ref 11.5–15.5)
WBC Count: 4 10*3/uL (ref 4.0–10.5)
nRBC: 0 % (ref 0.0–0.2)

## 2021-12-16 LAB — CMP (CANCER CENTER ONLY)
ALT: 17 U/L (ref 0–44)
AST: 17 U/L (ref 15–41)
Albumin: 4.2 g/dL (ref 3.5–5.0)
Alkaline Phosphatase: 78 U/L (ref 38–126)
Anion gap: 8 (ref 5–15)
BUN: 13 mg/dL (ref 6–20)
CO2: 27 mmol/L (ref 22–32)
Calcium: 10.3 mg/dL (ref 8.9–10.3)
Chloride: 107 mmol/L (ref 98–111)
Creatinine: 0.8 mg/dL (ref 0.44–1.00)
GFR, Estimated: >60 mL/min (ref >60)
Glucose, Bld: 92 mg/dL (ref 70–99)
Potassium: 3.9 mmol/L (ref 3.5–5.1)
Sodium: 142 mmol/L (ref 135–145)
Total Bilirubin: 0.7 mg/dL (ref 0.3–1.2)
Total Protein: 7.6 g/dL (ref 6.5–8.1)

## 2021-12-16 LAB — SAMPLE TO BLOOD BANK

## 2021-12-16 MED ORDER — GADOBUTROL 1 MMOL/ML IV SOLN
8.0000 mL | Freq: Once | INTRAVENOUS | Status: AC | PRN
  Administered 2021-12-16: 8 mL via INTRAVENOUS

## 2021-12-16 NOTE — Progress Notes (Signed)
Symptom Management Consult note [name]    Patient Care Team: Pcp, No as PCP - General Truitt Merle, MD as Consulting Physician (Hematology)    Name of the patient: Emma Rios  [id number]  [birth year]   Date of visit: 12/16/2021    Chief complaint/ Reason for visit- brain pain  Oncology History   No history exists.    Current Therapy: IV Venofer on 11/18/2021  Interval history- Emma Rios is a 56 yo female with oncologic history of left breast cancer in 2007 treated without known recurrence presenting to [facility] today with chief complaint of brain pain x1 month.  Patient states it does not feel like a headache.  She describes it as a severe pain whenever coughing, sneezing, bending over or laughing.  The pain is sharp.  It is located throughout her entire head and does not radiate to her neck.  She rates the pain 8 out of 10 in severity.  She states when the pain first started she thought it might be related to seasonal allergies and took 2 weeks of Claritin.  She has also been intermittently using Zyrtec and Sudafed without any symptom improvement.  She went to the ED for this pain and was found to be anemic to 6.4 without any source of bleeding.  She has since had colonoscopy and EGD that did not find any source of bleeding.  Patient has had 3 iron infusions and denies any improvement in her pain.  She had been taking ibuprofen when the pain first started although reported it also did not help.  She has not been taking any NSAIDs after finding out she was anemic. She denies any weight loss, night sweats, visual changes, neck pain, lightheadedness, dizziness, numbness, tingling, weakness. She denies any abnormal bleeding. She is postmenopausal.  Breast cancer treatment was at [facility] and included taxol and cytoxan with radiation. Finished Tamoxifen in 2007.     ROS  All other systems are reviewed and are negative for acute change except as noted in the  HPI.    Allergies  Allergen Reactions   Codeine Nausea Only     Past Medical History:  Diagnosis Date   Breast cancer ([place])      Past Surgical History:  Procedure Laterality Date   APPENDECTOMY     CHOLECYSTECTOMY      Social History   Socioeconomic History   Marital status: Married    Spouse name: Not on file   Number of children: 3   Years of education: Not on file   Highest education level: Not on file  Occupational History   Not on file  Tobacco Use   Smoking status: Never   Smokeless tobacco: Never  Substance and Sexual Activity   Alcohol use: Not Currently   Drug use: Not Currently   Sexual activity: Not on file  Other Topics Concern   Not on file  Social History Narrative   Not on file   Social Determinants of Health   Financial Resource Strain: Not on file  Food Insecurity: Not on file  Transportation Needs: Not on file  Physical Activity: Not on file  Stress: Not on file  Social Connections: Not on file  Intimate Partner Violence: Not on file    Family History  Problem Relation Age of Onset   Cancer Maternal Grandmother        metastatic cancer   Cancer Paternal Grandfather        prostate cancer  Current Outpatient Medications:    albuterol (VENTOLIN HFA) 108 (90 Base) MCG/ACT inhaler, Inhale 2 puffs into the lungs every 6 (six) hours as needed for wheezing or shortness of breath., Disp: , Rfl:    amoxicillin-clavulanate (AUGMENTIN) 875-125 MG tablet, Take 1 tablet by mouth every 12 (twelve) hours., Disp: 14 tablet, Rfl: 0   cetirizine (ZYRTEC) 10 MG tablet, Take 10 mg by mouth daily as needed for allergies or rhinitis., Disp: , Rfl:    pantoprazole (PROTONIX) 20 MG tablet, Take 1 tablet (20 mg total) by mouth daily., Disp: 30 tablet, Rfl: 0   pseudoephedrine (SUDAFED) 30 MG tablet, Take 30 mg by mouth every 4 (four) hours as needed for congestion., Disp: , Rfl:   PHYSICAL EXAM: ECOG FS:1 - Symptomatic but completely ambulatory     Vitals:   12/16/21 1126  BP: (!) 137/100  Pulse: 82  Resp: 16  Temp: 97.7 F (36.5 C)  TempSrc: Oral  SpO2: 100%  Weight: 157 lb 3.2 oz (71.3 kg)   Physical Exam Vitals and nursing note reviewed.  Constitutional:      Appearance: She is well-developed. She is not ill-appearing or toxic-appearing.  HENT:     Head: Normocephalic and atraumatic.     Comments: No tenderness to palpation of skull. No deformities or crepitus noted. No open wounds, abrasions or lacerations.    Right Ear: Tympanic membrane and external ear normal.     Left Ear: Tympanic membrane and external ear normal.     Nose: Septal deviation (chronic r deviated septum) present.     Right Nostril: No septal hematoma or occlusion.     Left Nostril: No septal hematoma or occlusion.  Eyes:     General: No scleral icterus.       Right eye: No discharge.        Left eye: No discharge.     Conjunctiva/sclera: Conjunctivae normal.  Neck:     Vascular: No JVD.  Cardiovascular:     Rate and Rhythm: Normal rate and regular rhythm.     Pulses: Normal pulses.     Heart sounds: Normal heart sounds.  Pulmonary:     Effort: Pulmonary effort is normal.     Breath sounds: Normal breath sounds.  Abdominal:     General: There is no distension.  Musculoskeletal:        General: Normal range of motion.     Cervical back: Normal range of motion.  Skin:    General: Skin is warm and dry.  Neurological:     Mental Status: She is oriented to person, place, and time.     GCS: GCS eye subscore is 4. GCS verbal subscore is 5. GCS motor subscore is 6.     Comments: Speech is clear and goal oriented, follows commands CN 7- minimal puff to right cheek compared to left which is normal. Otherwise cranial nerves intact 3-12 Normal strength in upper and lower extremities bilaterally including dorsiflexion and plantar flexion, strong and equal grip strength Sensation normal to light and sharp touch Moves extremities without ataxia,  coordination intact Normal finger to nose and rapid alternating movements Normal gait and balance    Psychiatric:        Behavior: Behavior normal.       LABORATORY DATA: I have reviewed the data as listed    Latest Ref Rng & Units 12/16/2021   11:20 AM 12/08/2021    9:19 AM 11/28/2021   12:43 PM  CBC  WBC  4.0 - 10.5 K/uL 4.0   2.7   4.4    Hemoglobin 12.0 - 15.0 g/dL 10.9   9.5   7.2    Hematocrit 36.0 - 46.0 % 36.8   32.6   25.9    Platelets 150 - 400 K/uL 358   293   379          Latest Ref Rng & Units 12/16/2021   11:20 AM 12/08/2021    9:19 AM 11/28/2021   12:43 PM  CMP  Glucose 70 - 99 mg/dL 92   103   90    BUN 6 - 20 mg/dL '13   11   16    '$ Creatinine 0.44 - 1.00 mg/dL 0.80   0.92   1.03    Sodium 135 - 145 mmol/L 142   140   139    Potassium 3.5 - 5.1 mmol/L 3.9   3.9   3.6    Chloride 98 - 111 mmol/L 107   107   106    CO2 22 - 32 mmol/L '27   26   25    '$ Calcium 8.9 - 10.3 mg/dL 10.3   9.5   8.8    Total Protein 6.5 - 8.1 g/dL 7.6   7.2   6.7    Total Bilirubin 0.3 - 1.2 mg/dL 0.7   0.4   0.3    Alkaline Phos 38 - 126 U/L 78   75   71    AST 15 - 41 U/L '17   14   14    '$ ALT 0 - 44 U/L '17   12   11         '$ RADIOGRAPHIC STUDIES: I have personally reviewed the radiological images as listed and agreed with the findings in the report. No images are attached to the encounter. CT Angio Head W or Wo Contrast  Result Date: 11/17/2021 CLINICAL DATA:  Headache, new or worsening. Symptoms began about 2 weeks ago. EXAM: CT ANGIOGRAPHY HEAD TECHNIQUE: Multidetector CT imaging of the head was performed using the standard protocol during bolus administration of intravenous contrast. Multiplanar CT image reconstructions and MIPs were obtained to evaluate the vascular anatomy. RADIATION DOSE REDUCTION: This exam was performed according to the departmental dose-optimization program which includes automated exposure control, adjustment of the mA and/or kV according to patient size  and/or use of iterative reconstruction technique. CONTRAST:  86m OMNIPAQUE IOHEXOL 350 MG/ML SOLN COMPARISON:  Head CT earlier same day FINDINGS: CTA HEAD Anterior circulation: Both internal carotid arteries are patent through the skull base and siphon regions. The anterior and middle cerebral vessels are patent and normal in appearance. No evidence of large vessel occlusion, aneurysm or stenosis. Posterior circulation: Both vertebral arteries are patent through the foramen magnum to the basilar. No basilar stenosis. Posterior circulation branch vessels are normal. Venous sinuses: Normal as seen.  See results of venography. Anatomic variants: None Near complete opacification of the right maxillary sinus as seen previously. Review of the MIP images confirms the above findings. IMPRESSION: Normal intracranial CT arterial angiography. Near complete opacification of the right maxillary sinus. Electronically Signed   By: MNelson ChimesM.D.   On: 11/17/2021 18:45   CT Head Wo Contrast  Result Date: 11/17/2021 CLINICAL DATA:  Severe headache, history of breast carcinoma EXAM: CT HEAD WITHOUT CONTRAST TECHNIQUE: Contiguous axial images were obtained from the base of the skull through the vertex without intravenous contrast. RADIATION DOSE REDUCTION: This exam was performed  according to the departmental dose-optimization program which includes automated exposure control, adjustment of the mA and/or kV according to patient size and/or use of iterative reconstruction technique. COMPARISON:  None Available. FINDINGS: Brain: No acute intracranial findings are seen. There are no signs of bleeding. There are no epidural or subdural fluid collections. Ventricles are not dilated. There is no shift of midline structures. Vascular: Unremarkable. Skull: Unremarkable. Sinuses/Orbits: There is almost complete opacification of right maxillary sinus. There is mild mucosal thickening in the ethmoid sinus. Other: None IMPRESSION: No acute  intracranial findings are seen in noncontrast CT brain. Chronic ethmoid and right maxillary sinusitis. Electronically Signed   By: Elmer Picker M.D.   On: 11/17/2021 12:57   CT VENOGRAM HEAD  Result Date: 11/17/2021 CLINICAL DATA:  Headache.  Dural venous sinus thrombosis suspected. EXAM: CT VENOGRAM HEAD TECHNIQUE: Venographic phase images of the brain were obtained following the administration of intravenous contrast. Multiplanar reformats and maximum intensity projections were generated. RADIATION DOSE REDUCTION: This exam was performed according to the departmental dose-optimization program which includes automated exposure control, adjustment of the mA and/or kV according to patient size and/or use of iterative reconstruction technique. CONTRAST:  63m OMNIPAQUE IOHEXOL 350 MG/ML SOLN COMPARISON:  Head CT same day. Arterial angiographic study same day. FINDINGS: Superior sagittal sinus is widely patent. Both transverse and sigmoid sinuses are patent and normal. Both jugular veins show flow. Deep veins appear normal. No visible cortical thrombosis. IMPRESSION: Negative intracranial CT venography. Electronically Signed   By: MNelson ChimesM.D.   On: 11/17/2021 18:46     ASSESSMENT & PLAN: Patient is a 56y.o. female  with oncologic history of left breast cancer in 2007 who has  newly established care with Dr. FBurr Medico  I have viewed most recent ED, oncology GI, and notes as well as lab work.   #)Head pressure vs pain- Patient nontoxic appearing. She is afebrile, BP slightly elevated at 137/100.  CBC today shows hemoglobin 10.5, improved from recent ED visit when it was 6.4 CMP overall unremarkable, no signs of endorgan damage.  Patient is already tried multiple antihistamines without any symptom improvement.  She is symptomatic with severe pain when leaning forward during exam. Patient unable to puff her right cheek-concern for acute neuro change. With concern for metastatic disease/recurrence and an  acute neuro symptom patient will need MR brain for further evaluation.  Patient has appointment scheduled with oncologist next week for ongoing evaluation of anemia needing to rule out malignancy.  Discussed ED precautions should symptoms worsen.   Visit Diagnosis: 1. Anemia, unspecified type   2. Abnormal neurological exam      Orders Placed This Encounter  Procedures   MR Brain W Wo Contrast    Standing Status:   Future    Number of Occurrences:   1    Standing Expiration Date:   12/16/2022    Order Specific Question:   If indicated for the ordered procedure, I authorize the administration of contrast media per Radiology protocol    Answer:   Yes    Order Specific Question:   What is the patient's sedation requirement?    Answer:   No Sedation    Order Specific Question:   Does the patient have a pacemaker or implanted devices?    Answer:   No    Order Specific Question:   Use SRS Protocol?    Answer:   No    Order Specific Question:   Preferred imaging location?  Answer:   [facility] (table limit - 500 lbs)   CMP ([place] only)    Standing Status:   Future    Number of Occurrences:   1    Standing Expiration Date:   12/17/2022   Sample to Blood Bank    Standing Status:   Future    Number of Occurrences:   1    Standing Expiration Date:   12/17/2022    All questions were answered. The patient knows to call the clinic with any problems, questions or concerns. No barriers to learning was detected.  I have spent a total of 20 minutes minutes of face-to-face and non-face-to-face time, preparing to see the patient, obtaining and/or reviewing separately obtained history, performing a medically appropriate examination, counseling and educating the patient, ordering tests,  documenting clinical information in the electronic health record, and care coordination.     Thank you for allowing me to participate in the care of this patient.    Barrie Folk,  PA-C Department of Hematology/Oncology [facility] Phone: [phone]  [phone]    12/16/2021 5:37 PM

## 2021-12-21 ENCOUNTER — Other Ambulatory Visit: Payer: Self-pay

## 2021-12-21 ENCOUNTER — Inpatient Hospital Stay: Payer: BC Managed Care – PPO

## 2021-12-21 ENCOUNTER — Inpatient Hospital Stay (HOSPITAL_BASED_OUTPATIENT_CLINIC_OR_DEPARTMENT_OTHER): Payer: BC Managed Care – PPO | Admitting: Hematology and Oncology

## 2021-12-21 VITALS — BP 128/82 | HR 100 | Temp 97.9°F | Resp 18 | Ht 66.5 in | Wt 156.7 lb

## 2021-12-21 DIAGNOSIS — R519 Headache, unspecified: Secondary | ICD-10-CM | POA: Diagnosis not present

## 2021-12-21 DIAGNOSIS — Z809 Family history of malignant neoplasm, unspecified: Secondary | ICD-10-CM | POA: Insufficient documentation

## 2021-12-21 DIAGNOSIS — D649 Anemia, unspecified: Secondary | ICD-10-CM | POA: Diagnosis not present

## 2021-12-21 DIAGNOSIS — Z8042 Family history of malignant neoplasm of prostate: Secondary | ICD-10-CM | POA: Insufficient documentation

## 2021-12-21 DIAGNOSIS — Z923 Personal history of irradiation: Secondary | ICD-10-CM | POA: Insufficient documentation

## 2021-12-21 DIAGNOSIS — Z853 Personal history of malignant neoplasm of breast: Secondary | ICD-10-CM | POA: Diagnosis not present

## 2021-12-21 DIAGNOSIS — D500 Iron deficiency anemia secondary to blood loss (chronic): Secondary | ICD-10-CM

## 2021-12-21 DIAGNOSIS — Z885 Allergy status to narcotic agent status: Secondary | ICD-10-CM | POA: Diagnosis not present

## 2021-12-21 DIAGNOSIS — Z9049 Acquired absence of other specified parts of digestive tract: Secondary | ICD-10-CM | POA: Insufficient documentation

## 2021-12-21 DIAGNOSIS — R718 Other abnormality of red blood cells: Secondary | ICD-10-CM | POA: Insufficient documentation

## 2021-12-21 DIAGNOSIS — J320 Chronic maxillary sinusitis: Secondary | ICD-10-CM | POA: Diagnosis not present

## 2021-12-21 DIAGNOSIS — D509 Iron deficiency anemia, unspecified: Secondary | ICD-10-CM | POA: Diagnosis present

## 2021-12-21 DIAGNOSIS — Z79899 Other long term (current) drug therapy: Secondary | ICD-10-CM | POA: Diagnosis not present

## 2021-12-21 LAB — CBC WITH DIFFERENTIAL (CANCER CENTER ONLY)
Abs Immature Granulocytes: 0 10*3/uL (ref 0.00–0.07)
Basophils Absolute: 0.1 10*3/uL (ref 0.0–0.1)
Basophils Relative: 1 %
Eosinophils Absolute: 0.1 10*3/uL (ref 0.0–0.5)
Eosinophils Relative: 1 %
HCT: 37 % (ref 36.0–46.0)
Hemoglobin: 10.9 g/dL — ABNORMAL LOW (ref 12.0–15.0)
Immature Granulocytes: 0 %
Lymphocytes Relative: 25 %
Lymphs Abs: 1.6 10*3/uL (ref 0.7–4.0)
MCH: 22.9 pg — ABNORMAL LOW (ref 26.0–34.0)
MCHC: 29.5 g/dL — ABNORMAL LOW (ref 30.0–36.0)
MCV: 77.9 fL — ABNORMAL LOW (ref 80.0–100.0)
Monocytes Absolute: 0.4 10*3/uL (ref 0.1–1.0)
Monocytes Relative: 6 %
Neutro Abs: 4.1 10*3/uL (ref 1.7–7.7)
Neutrophils Relative %: 67 %
Platelet Count: 316 10*3/uL (ref 150–400)
RBC: 4.75 MIL/uL (ref 3.87–5.11)
WBC Count: 6.2 10*3/uL (ref 4.0–10.5)
nRBC: 0 % (ref 0.0–0.2)

## 2021-12-21 LAB — IRON AND IRON BINDING CAPACITY (CC-WL,HP ONLY)
Iron: 42 ug/dL (ref 28–170)
Saturation Ratios: 10 % — ABNORMAL LOW (ref 10.4–31.8)
TIBC: 405 ug/dL (ref 250–450)
UIBC: 363 ug/dL

## 2021-12-21 MED ORDER — AMOXICILLIN-POT CLAVULANATE 875-125 MG PO TABS: 1.0000 | ORAL_TABLET | Freq: Two times a day (BID) | ORAL | 0 refills | Status: DC

## 2021-12-21 NOTE — Progress Notes (Signed)
[place]   Telephone:([phone] [phone]   Clinic New consult Note   Patient Care Team: Pcp, No as PCP - General Truitt Merle, MD as Consulting Physician (Hematology) 12/21/2021  CHIEF COMPLAINTS/PURPOSE OF CONSULTATION:  Iron Deficient Anemia  ASSESSMENT & PLAN:  Emma Rios is a 56 y.o. postmenopausal female with  1. Iron Deficient Anemia -She is a patient of Dr. Burr Medico who is status post iron infusion and is here for follow-up.  She recently had endoscopy and colonoscopy with her gastroenterologist and pathology was negative for any dysplasia or malignancy.   She is doing well for the most part except for ongoing pain in her head especially when she moves or coughs or bends over. CBC from visit today showed stable hemoglobin at 10.9, improving microcytosis and hypochromia, ferritin at 86.  She was encouraged to continue oral iron.   With regards to scans for further evaluation of iron deficiency anemia, have recommended that she discuss with Dr. Burr Medico. I have encouraged considering capsule endoscopy for small bowel evaluation. Overall it appears that she may have developed iron deficiency anemia over a span of time since we do not have labs for almost 2 years and since she was minimally symptomatic from severe anemia at the time of her diagnosis. She will discuss with Dr. Burr Medico about further recommendations.  2. H/o Left Breast Cancer in 2007, stage IIA T2, N0, IDC, grade 2 -s/p lumpectomy on 10/16/05, adjuvant chemo with taxol/cytoxan through 01/2007, adjuvant radiation, and 9 years of tamoxifen through 04/2016.  3.  Headache/brain pain, no MRI evidence of malignancy.  I do believe the symptoms that she is describing is likely from sinus blockage as described in the MRI.  I have recommended that she consider another course of Augmentin and probiotics. She does admit to some improvement in it since she last completed her week of antibiotics.   PLAN: Follow-up  with Dr. Burr Medico as scheduled  HISTORY OF PRESENTING ILLNESS:  Emma Rios 56 y.o. female is here because of iron deficient anemia. She initially presented with pain to her brain. She explains it's not a headache but is a pain that occurs when her brain is jostled (such as with coughing, bending over, etc). She denies neck pain. She notes a history of migraines, so she adds she knows what a headache feels like.  She was found to have abnormal CBC from 11/17/21. Prior CBC in 05/2020 was WNL.   Interval history  She continues to report some pain in the head especially when she coughs or bends over.  She does admit that it has gotten better after the last course of antibiotics.  She otherwise has been taking iron as prescribed.  She had endoscopy and colonoscopy since her last visit and no malignancy or dysplasia noted.   Rest of the pertinent 10 point ROS reviewed and negative.    REVIEW OF SYSTEMS:   Constitutional: Denies fevers, chills or abnormal night sweats Eyes: Denies blurriness of vision, double vision or watery eyes Ears, nose, mouth, throat, and face: Denies mucositis or sore throat Respiratory: Denies cough, dyspnea or wheezes Cardiovascular: Denies palpitation, chest discomfort or lower extremity swelling Gastrointestinal:  Denies nausea, heartburn or change in bowel habits Skin: Denies abnormal skin rashes Lymphatics: Denies new lymphadenopathy or easy bruising Neurological:Denies numbness, tingling or new weaknesses Behavioral/Psych: Mood is stable, no new changes   All other systems were reviewed with the patient and are negative.   MEDICAL HISTORY:  Past Medical  History:  Diagnosis Date   Breast cancer ([place])     SURGICAL HISTORY: Past Surgical History:  Procedure Laterality Date   APPENDECTOMY     CHOLECYSTECTOMY      SOCIAL HISTORY: Social History   Socioeconomic History   Marital status: Married    Spouse name: Not on file   Number of children: 3   Years  of education: Not on file   Highest education level: Not on file  Occupational History   Not on file  Tobacco Use   Smoking status: Never   Smokeless tobacco: Never  Substance and Sexual Activity   Alcohol use: Not Currently   Drug use: Not Currently   Sexual activity: Not on file  Other Topics Concern   Not on file  Social History Narrative   Not on file   Social Determinants of Health   Financial Resource Strain: Not on file  Food Insecurity: Not on file  Transportation Needs: Not on file  Physical Activity: Not on file  Stress: Not on file  Social Connections: Not on file  Intimate Partner Violence: Not on file    FAMILY HISTORY: Family History  Problem Relation Age of Onset   Cancer Maternal Grandmother        metastatic cancer   Cancer Paternal Grandfather        prostate cancer    ALLERGIES:  is allergic to codeine.  MEDICATIONS:  Current Outpatient Medications  Medication Sig Dispense Refill   albuterol (VENTOLIN HFA) 108 (90 Base) MCG/ACT inhaler Inhale 2 puffs into the lungs every 6 (six) hours as needed for wheezing or shortness of breath.     amoxicillin-clavulanate (AUGMENTIN) 875-125 MG tablet Take 1 tablet by mouth every 12 (twelve) hours. 14 tablet 0   cetirizine (ZYRTEC) 10 MG tablet Take 10 mg by mouth daily as needed for allergies or rhinitis.     pantoprazole (PROTONIX) 20 MG tablet Take 1 tablet (20 mg total) by mouth daily. 30 tablet 0   pseudoephedrine (SUDAFED) 30 MG tablet Take 30 mg by mouth every 4 (four) hours as needed for congestion.     No current facility-administered medications for this visit.    PHYSICAL EXAMINATION: ECOG PERFORMANCE STATUS: 1 - Symptomatic but completely ambulatory  Vitals:   12/21/21 1540  BP: 128/82  Pulse: 100  Resp: 18  Temp: 97.9 F (36.6 C)  SpO2: 100%   Filed Weights   12/21/21 1540  Weight: 156 lb 11.2 oz (71.1 kg)   Physical Exam Constitutional:      Appearance: Normal appearance.   Cardiovascular:     Rate and Rhythm: Normal rate and regular rhythm.     Pulses: Normal pulses.     Heart sounds: Normal heart sounds.  Pulmonary:     Effort: Pulmonary effort is normal.     Breath sounds: Normal breath sounds.  Abdominal:     General: Abdomen is flat. Bowel sounds are normal.  Musculoskeletal:        General: No swelling or tenderness.     Cervical back: Normal range of motion and neck supple. No rigidity.  Lymphadenopathy:     Cervical: No cervical adenopathy.  Skin:    General: Skin is warm and dry.  Neurological:     Mental Status: She is alert.     LABORATORY DATA:  I have reviewed the data as listed    Latest Ref Rng & Units 12/21/2021    3:28 PM 12/16/2021   11:20 AM 12/08/2021  9:19 AM  CBC  WBC 4.0 - 10.5 K/uL 6.2   4.0   2.7    Hemoglobin 12.0 - 15.0 g/dL 10.9   10.9   9.5    Hematocrit 36.0 - 46.0 % 37.0   36.8   32.6    Platelets 150 - 400 K/uL 316   358   293         Latest Ref Rng & Units 12/16/2021   11:20 AM 12/08/2021    9:19 AM 11/28/2021   12:43 PM  CMP  Glucose 70 - 99 mg/dL 92   103   90    BUN 6 - 20 mg/dL '13   11   16    '$ Creatinine 0.44 - 1.00 mg/dL 0.80   0.92   1.03    Sodium 135 - 145 mmol/L 142   140   139    Potassium 3.5 - 5.1 mmol/L 3.9   3.9   3.6    Chloride 98 - 111 mmol/L 107   107   106    CO2 22 - 32 mmol/L '27   26   25    '$ Calcium 8.9 - 10.3 mg/dL 10.3   9.5   8.8    Total Protein 6.5 - 8.1 g/dL 7.6   7.2   6.7    Total Bilirubin 0.3 - 1.2 mg/dL 0.7   0.4   0.3    Alkaline Phos 38 - 126 U/L 78   75   71    AST 15 - 41 U/L '17   14   14    '$ ALT 0 - 44 U/L '17   12   11       '$ RADIOGRAPHIC STUDIES: I have personally reviewed the radiological images as listed and agreed with the findings in the report. MR Brain W Wo Contrast  Result Date: 12/16/2021 CLINICAL DATA:  Headache, new or worsening (Age >= 50y) EXAM: MRI HEAD WITHOUT AND WITH CONTRAST TECHNIQUE: Multiplanar, multiecho pulse sequences of the brain and  surrounding structures were obtained without and with intravenous contrast. CONTRAST:  31m GADAVIST GADOBUTROL 1 MMOL/ML IV SOLN COMPARISON:  CT Nov 17, 2021. FINDINGS: Brain: No acute infarction, hemorrhage, hydrocephalus, extra-axial collection or mass lesion. No pathologic enhancement. Vascular: Major arterial flow voids are maintained skull base. Skull and upper cervical spine: Normal marrow signal. Sinuses/Orbits: Near complete right maxillary sinus opacification. Other: No mastoid effusions. IMPRESSION: 1. No evidence of acute intracranial abnormality. 2. Near complete right maxillary sinus opacification. Correlate with signs/symptoms of sinusitis. Electronically Signed   By: FMargaretha SheffieldM.D.   On: 12/16/2021 18:11    No orders of the defined types were placed in this encounter.   All questions were answered. The patient knows to call the clinic with any problems, questions or concerns. The total time spent in the appointment was 30 minutes.     PBenay Pike MD 12/21/2021 3:57 PM

## 2021-12-22 ENCOUNTER — Encounter: Payer: Self-pay | Admitting: Hematology

## 2021-12-22 ENCOUNTER — Encounter: Payer: Self-pay | Admitting: Hematology and Oncology

## 2021-12-22 LAB — FERRITIN: Ferritin: 86 ng/mL (ref 11–307)

## 2021-12-23 ENCOUNTER — Telehealth: Payer: Self-pay | Admitting: Hematology

## 2021-12-23 NOTE — Telephone Encounter (Signed)
Scheduled appointment per 06/07 los. Left message.

## 2021-12-26 ENCOUNTER — Telehealth: Payer: Self-pay | Admitting: Hematology

## 2021-12-26 NOTE — Telephone Encounter (Signed)
.  Called pt per 6/9 inbasket , Patient was unavailable, a message with appt time and date was left with number on file.

## 2022-01-20 ENCOUNTER — Ambulatory Visit: Payer: BC Managed Care – PPO | Admitting: Hematology

## 2022-01-25 ENCOUNTER — Ambulatory Visit: Payer: BC Managed Care – PPO | Admitting: Hematology

## 2022-02-01 ENCOUNTER — Inpatient Hospital Stay: Payer: BC Managed Care – PPO

## 2022-02-01 ENCOUNTER — Inpatient Hospital Stay: Payer: BC Managed Care – PPO | Attending: Nurse Practitioner | Admitting: Hematology

## 2022-02-01 ENCOUNTER — Other Ambulatory Visit: Payer: Self-pay

## 2022-02-01 VITALS — BP 128/86 | HR 114 | Temp 98.2°F | Resp 18 | Ht 66.5 in | Wt 157.7 lb

## 2022-02-01 DIAGNOSIS — D500 Iron deficiency anemia secondary to blood loss (chronic): Secondary | ICD-10-CM

## 2022-02-01 DIAGNOSIS — Z885 Allergy status to narcotic agent status: Secondary | ICD-10-CM | POA: Diagnosis not present

## 2022-02-01 DIAGNOSIS — Z9049 Acquired absence of other specified parts of digestive tract: Secondary | ICD-10-CM | POA: Insufficient documentation

## 2022-02-01 DIAGNOSIS — Z79899 Other long term (current) drug therapy: Secondary | ICD-10-CM | POA: Insufficient documentation

## 2022-02-01 DIAGNOSIS — R519 Headache, unspecified: Secondary | ICD-10-CM | POA: Diagnosis not present

## 2022-02-01 DIAGNOSIS — Z853 Personal history of malignant neoplasm of breast: Secondary | ICD-10-CM | POA: Diagnosis not present

## 2022-02-01 DIAGNOSIS — D509 Iron deficiency anemia, unspecified: Secondary | ICD-10-CM | POA: Insufficient documentation

## 2022-02-01 DIAGNOSIS — Z8719 Personal history of other diseases of the digestive system: Secondary | ICD-10-CM | POA: Insufficient documentation

## 2022-02-01 DIAGNOSIS — Z923 Personal history of irradiation: Secondary | ICD-10-CM | POA: Insufficient documentation

## 2022-02-01 DIAGNOSIS — D649 Anemia, unspecified: Secondary | ICD-10-CM

## 2022-02-01 LAB — CBC WITH DIFFERENTIAL (CANCER CENTER ONLY)
Abs Immature Granulocytes: 0.02 10*3/uL (ref 0.00–0.07)
Basophils Absolute: 0 10*3/uL (ref 0.0–0.1)
Basophils Relative: 1 %
Eosinophils Absolute: 0.1 10*3/uL (ref 0.0–0.5)
Eosinophils Relative: 1 %
HCT: 38 % (ref 36.0–46.0)
Hemoglobin: 12.3 g/dL (ref 12.0–15.0)
Immature Granulocytes: 0 %
Lymphocytes Relative: 28 %
Lymphs Abs: 1.7 10*3/uL (ref 0.7–4.0)
MCH: 25.6 pg — ABNORMAL LOW (ref 26.0–34.0)
MCHC: 32.4 g/dL (ref 30.0–36.0)
MCV: 79 fL — ABNORMAL LOW (ref 80.0–100.0)
Monocytes Absolute: 0.5 10*3/uL (ref 0.1–1.0)
Monocytes Relative: 7 %
Neutro Abs: 3.8 10*3/uL (ref 1.7–7.7)
Neutrophils Relative %: 63 %
Platelet Count: 287 10*3/uL (ref 150–400)
RBC: 4.81 MIL/uL (ref 3.87–5.11)
RDW: 21.7 % — ABNORMAL HIGH (ref 11.5–15.5)
WBC Count: 6.1 10*3/uL (ref 4.0–10.5)
nRBC: 0 % (ref 0.0–0.2)

## 2022-02-01 LAB — FERRITIN: Ferritin: 13 ng/mL (ref 11–307)

## 2022-02-01 NOTE — Progress Notes (Addendum)
[place]   Telephone:([phone] Fax:(336) [phone]   Clinic Follow up Note   Patient Care Team: Pcp, No as PCP - General Truitt Merle, MD as Consulting Physician (Hematology)  Date of Service:  02/01/2022  CHIEF COMPLAINT: f/u of iron deficient anemia  CURRENT THERAPY:  Surveillance  ASSESSMENT & PLAN:  Emma Rios is a 56 y.o. female with   1. Iron Deficient Anemia, likely GI bleeding from gastritis or diverticulitis -she initially presented to ED on 11/17/21 with head pain triggered by movement. CBC showed hgb 6.4, iron 8, and ferritin 2. Fecal occult blood test was also positive. -colonoscopy/EGD on 11/30/21 with Dr. Noralee Stain at [facility] was negative for malignancy but showed 7 cm hiatal hernia, diffuse gastritis, biopsy was negative for malignancy.  I reviewed her outside records and discussed with patient. -Her iron deficiency is probably related to small GI bleeding, from gastritis or possible related to hiatal hernia or diverticulitis. -she received three doses of IV Venofer in 11/2021.  Anemia resolved. -she responded well, iron 42 and ferritin 86 on 12/21/21. Labs reviewed, her hgb is up to 12.3 today, ferritin is pending. -we will plan for repeat labs monthly, with phone visit in 3 months.   2. H/o Left Breast Cancer in 2007, stage IIA T2, N0, IDC, grade 2 -s/p lumpectomy on 10/16/05, adjuvant chemo with taxol/cytoxan through 01/2007, adjuvant radiation, and 9 years of tamoxifen through 04/2016.   3. Headaches -Secondary sinus infection, resolved with treatment.   PLAN: -lab monthly x3 -phone visit after lab in 3 months -IV Venofer if ferritin less than 30    No problem-specific Assessment & Plan notes found for this encounter.   INTERVAL HISTORY:  Emma Rios is here for a follow up of IDA. She was last seen by Dr. Chryl Heck in my absence on 12/21/21. She presents to the clinic alone. She reports she is doing well. She denies any new concerns or complaints.   All  other systems were reviewed with the patient and are negative.  MEDICAL HISTORY:  Past Medical History:  Diagnosis Date   Breast cancer [facility])     SURGICAL HISTORY: Past Surgical History:  Procedure Laterality Date   APPENDECTOMY     CHOLECYSTECTOMY      I have reviewed the social history and family history with the patient and they are unchanged from previous note.  ALLERGIES:  is allergic to codeine.  MEDICATIONS:  Current Outpatient Medications  Medication Sig Dispense Refill   albuterol (VENTOLIN HFA) 108 (90 Base) MCG/ACT inhaler Inhale 2 puffs into the lungs every 6 (six) hours as needed for wheezing or shortness of breath.     amoxicillin-clavulanate (AUGMENTIN) 875-125 MG tablet Take 1 tablet by mouth every 12 (twelve) hours. 14 tablet 0   cetirizine (ZYRTEC) 10 MG tablet Take 10 mg by mouth daily as needed for allergies or rhinitis.     pantoprazole (PROTONIX) 20 MG tablet Take 1 tablet (20 mg total) by mouth daily. 30 tablet 0   pseudoephedrine (SUDAFED) 30 MG tablet Take 30 mg by mouth every 4 (four) hours as needed for congestion.     No current facility-administered medications for this visit.    PHYSICAL EXAMINATION: ECOG PERFORMANCE STATUS: 0 - Asymptomatic  Vitals:   02/01/22 1433  BP: 128/86  Pulse: (!) 114  Resp: 18  Temp: 98.2 F (36.8 C)  SpO2: 98%   Wt Readings from Last 3 Encounters:  02/01/22 157 lb 11.2 oz (71.5 kg)  12/21/21  156 lb 11.2 oz (71.1 kg)  12/16/21 157 lb 3.2 oz (71.3 kg)     GENERAL:alert, no distress and comfortable SKIN: skin color normal, no rashes or significant lesions EYES: normal, Conjunctiva are pink and non-injected, sclera clear  NEURO: alert & oriented x 3 with fluent speech  LABORATORY DATA:  I have reviewed the data as listed    Latest Ref Rng & Units 02/01/2022    2:14 PM 12/21/2021    3:28 PM 12/16/2021   11:20 AM  CBC  WBC 4.0 - 10.5 K/uL 6.1  6.2  4.0   Hemoglobin 12.0 - 15.0 g/dL 12.3  10.9  10.9    Hematocrit 36.0 - 46.0 % 38.0  37.0  36.8   Platelets 150 - 400 K/uL 287  316  358         Latest Ref Rng & Units 12/16/2021   11:20 AM 12/08/2021    9:19 AM 11/28/2021   12:43 PM  CMP  Glucose 70 - 99 mg/dL 92  103  90   BUN 6 - 20 mg/dL '13  11  16   '$ Creatinine 0.44 - 1.00 mg/dL 0.80  0.92  1.03   Sodium 135 - 145 mmol/L 142  140  139   Potassium 3.5 - 5.1 mmol/L 3.9  3.9  3.6   Chloride 98 - 111 mmol/L 107  107  106   CO2 22 - 32 mmol/L '27  26  25   '$ Calcium 8.9 - 10.3 mg/dL 10.3  9.5  8.8   Total Protein 6.5 - 8.1 g/dL 7.6  7.2  6.7   Total Bilirubin 0.3 - 1.2 mg/dL 0.7  0.4  0.3   Alkaline Phos 38 - 126 U/L 78  75  71   AST 15 - 41 U/L '17  14  14   '$ ALT 0 - 44 U/L '17  12  11       '$ RADIOGRAPHIC STUDIES: I have personally reviewed the radiological images as listed and agreed with the findings in the report. No results found.    No orders of the defined types were placed in this encounter.  All questions were answered. The patient knows to call the clinic with any problems, questions or concerns. No barriers to learning was detected. The total time spent in the appointment was 30 minutes.     Truitt Merle, MD 02/01/2022   I, Wilburn Mylar, am acting as scribe for Truitt Merle, MD.   I have reviewed the above documentation for accuracy and completeness, and I agree with the above.

## 2022-02-02 ENCOUNTER — Telehealth: Payer: Self-pay | Admitting: Hematology

## 2022-02-02 ENCOUNTER — Other Ambulatory Visit: Payer: Self-pay

## 2022-02-02 NOTE — Telephone Encounter (Signed)
Scheduled follow-up appointments per 7/19 los. Patient is aware.

## 2022-02-02 NOTE — Telephone Encounter (Signed)
.  Called patient to schedule appointment per 7/19 inbasket, patient is aware of date and time.   

## 2022-02-07 ENCOUNTER — Other Ambulatory Visit: Payer: Self-pay

## 2022-02-07 ENCOUNTER — Inpatient Hospital Stay: Payer: BC Managed Care – PPO

## 2022-02-07 ENCOUNTER — Encounter: Payer: Self-pay | Admitting: Hematology

## 2022-02-07 VITALS — BP 131/86 | HR 91 | Temp 98.3°F | Resp 18

## 2022-02-07 DIAGNOSIS — D509 Iron deficiency anemia, unspecified: Secondary | ICD-10-CM | POA: Diagnosis not present

## 2022-02-07 DIAGNOSIS — D500 Iron deficiency anemia secondary to blood loss (chronic): Secondary | ICD-10-CM

## 2022-02-07 MED ORDER — SODIUM CHLORIDE 0.9 % IV SOLN
300.0000 mg | Freq: Once | INTRAVENOUS | Status: AC
  Administered 2022-02-07: 300 mg via INTRAVENOUS
  Filled 2022-02-07: qty 300

## 2022-02-07 MED ORDER — LORATADINE 10 MG PO TABS: 10.0000 mg | ORAL_TABLET | Freq: Every day | ORAL | Status: DC

## 2022-02-07 NOTE — Patient Instructions (Signed)

## 2022-02-08 ENCOUNTER — Inpatient Hospital Stay: Payer: BC Managed Care – PPO

## 2022-03-06 ENCOUNTER — Other Ambulatory Visit: Payer: Self-pay

## 2022-03-06 ENCOUNTER — Inpatient Hospital Stay: Payer: BC Managed Care – PPO | Attending: Nurse Practitioner

## 2022-03-06 DIAGNOSIS — D649 Anemia, unspecified: Secondary | ICD-10-CM

## 2022-03-06 DIAGNOSIS — D509 Iron deficiency anemia, unspecified: Secondary | ICD-10-CM | POA: Insufficient documentation

## 2022-03-06 DIAGNOSIS — D500 Iron deficiency anemia secondary to blood loss (chronic): Secondary | ICD-10-CM

## 2022-03-06 LAB — CBC WITH DIFFERENTIAL (CANCER CENTER ONLY)
Abs Immature Granulocytes: 0.01 10*3/uL (ref 0.00–0.07)
Basophils Absolute: 0.1 10*3/uL (ref 0.0–0.1)
Basophils Relative: 1 %
Eosinophils Absolute: 0.2 10*3/uL (ref 0.0–0.5)
Eosinophils Relative: 3 %
HCT: 39.9 % (ref 36.0–46.0)
Hemoglobin: 13.3 g/dL (ref 12.0–15.0)
Immature Granulocytes: 0 %
Lymphocytes Relative: 39 %
Lymphs Abs: 2.1 10*3/uL (ref 0.7–4.0)
MCH: 27.4 pg (ref 26.0–34.0)
MCHC: 33.3 g/dL (ref 30.0–36.0)
MCV: 82.1 fL (ref 80.0–100.0)
Monocytes Absolute: 0.4 10*3/uL (ref 0.1–1.0)
Monocytes Relative: 8 %
Neutro Abs: 2.7 10*3/uL (ref 1.7–7.7)
Neutrophils Relative %: 49 %
Platelet Count: 269 10*3/uL (ref 150–400)
RBC: 4.86 MIL/uL (ref 3.87–5.11)
RDW: 17.5 % — ABNORMAL HIGH (ref 11.5–15.5)
WBC Count: 5.5 10*3/uL (ref 4.0–10.5)
nRBC: 0 % (ref 0.0–0.2)

## 2022-03-07 LAB — FERRITIN: Ferritin: 36 ng/mL (ref 11–307)

## 2022-03-08 ENCOUNTER — Other Ambulatory Visit: Payer: BC Managed Care – PPO

## 2022-04-06 ENCOUNTER — Other Ambulatory Visit: Payer: Self-pay | Admitting: Hematology

## 2022-04-06 DIAGNOSIS — Z1231 Encounter for screening mammogram for malignant neoplasm of breast: Secondary | ICD-10-CM

## 2022-04-11 ENCOUNTER — Other Ambulatory Visit: Payer: Self-pay

## 2022-04-11 ENCOUNTER — Inpatient Hospital Stay: Payer: BC Managed Care – PPO | Attending: Nurse Practitioner

## 2022-04-11 DIAGNOSIS — D500 Iron deficiency anemia secondary to blood loss (chronic): Secondary | ICD-10-CM

## 2022-04-11 DIAGNOSIS — D649 Anemia, unspecified: Secondary | ICD-10-CM

## 2022-04-11 DIAGNOSIS — D509 Iron deficiency anemia, unspecified: Secondary | ICD-10-CM | POA: Insufficient documentation

## 2022-04-11 LAB — CBC WITH DIFFERENTIAL (CANCER CENTER ONLY)
Abs Immature Granulocytes: 0.01 10*3/uL (ref 0.00–0.07)
Basophils Absolute: 0.1 10*3/uL (ref 0.0–0.1)
Basophils Relative: 1 %
Eosinophils Absolute: 0.1 10*3/uL (ref 0.0–0.5)
Eosinophils Relative: 2 %
HCT: 41 % (ref 36.0–46.0)
Hemoglobin: 13.8 g/dL (ref 12.0–15.0)
Immature Granulocytes: 0 %
Lymphocytes Relative: 34 %
Lymphs Abs: 2.1 10*3/uL (ref 0.7–4.0)
MCH: 28.4 pg (ref 26.0–34.0)
MCHC: 33.7 g/dL (ref 30.0–36.0)
MCV: 84.4 fL (ref 80.0–100.0)
Monocytes Absolute: 0.4 10*3/uL (ref 0.1–1.0)
Monocytes Relative: 7 %
Neutro Abs: 3.4 10*3/uL (ref 1.7–7.7)
Neutrophils Relative %: 56 %
Platelet Count: 257 10*3/uL (ref 150–400)
RBC: 4.86 MIL/uL (ref 3.87–5.11)
RDW: 15.4 % (ref 11.5–15.5)
WBC Count: 6.1 10*3/uL (ref 4.0–10.5)
nRBC: 0 % (ref 0.0–0.2)

## 2022-04-11 LAB — IRON AND IRON BINDING CAPACITY (CC-WL,HP ONLY)
Iron: 102 ug/dL (ref 28–170)
Saturation Ratios: 26 % (ref 10.4–31.8)
TIBC: 395 ug/dL (ref 250–450)
UIBC: 293 ug/dL (ref 148–442)

## 2022-04-11 LAB — FERRITIN: Ferritin: 26 ng/mL (ref 11–307)

## 2022-04-13 ENCOUNTER — Encounter: Payer: Self-pay | Admitting: Hematology

## 2022-05-09 ENCOUNTER — Inpatient Hospital Stay: Payer: BC Managed Care – PPO | Attending: Nurse Practitioner

## 2022-05-09 ENCOUNTER — Other Ambulatory Visit: Payer: Self-pay

## 2022-05-09 DIAGNOSIS — Z9049 Acquired absence of other specified parts of digestive tract: Secondary | ICD-10-CM | POA: Diagnosis not present

## 2022-05-09 DIAGNOSIS — Z923 Personal history of irradiation: Secondary | ICD-10-CM | POA: Diagnosis not present

## 2022-05-09 DIAGNOSIS — D509 Iron deficiency anemia, unspecified: Secondary | ICD-10-CM | POA: Diagnosis present

## 2022-05-09 DIAGNOSIS — Z79899 Other long term (current) drug therapy: Secondary | ICD-10-CM | POA: Diagnosis not present

## 2022-05-09 DIAGNOSIS — Z885 Allergy status to narcotic agent status: Secondary | ICD-10-CM | POA: Insufficient documentation

## 2022-05-09 DIAGNOSIS — D649 Anemia, unspecified: Secondary | ICD-10-CM

## 2022-05-09 DIAGNOSIS — Z8719 Personal history of other diseases of the digestive system: Secondary | ICD-10-CM | POA: Diagnosis not present

## 2022-05-09 DIAGNOSIS — Z853 Personal history of malignant neoplasm of breast: Secondary | ICD-10-CM | POA: Insufficient documentation

## 2022-05-09 DIAGNOSIS — D500 Iron deficiency anemia secondary to blood loss (chronic): Secondary | ICD-10-CM

## 2022-05-09 DIAGNOSIS — R5383 Other fatigue: Secondary | ICD-10-CM | POA: Diagnosis not present

## 2022-05-09 DIAGNOSIS — R519 Headache, unspecified: Secondary | ICD-10-CM | POA: Diagnosis not present

## 2022-05-09 LAB — CBC WITH DIFFERENTIAL (CANCER CENTER ONLY)
Abs Immature Granulocytes: 0.01 10*3/uL (ref 0.00–0.07)
Basophils Absolute: 0.1 10*3/uL (ref 0.0–0.1)
Basophils Relative: 1 %
Eosinophils Absolute: 0.1 10*3/uL (ref 0.0–0.5)
Eosinophils Relative: 2 %
HCT: 41.9 % (ref 36.0–46.0)
Hemoglobin: 14.2 g/dL (ref 12.0–15.0)
Immature Granulocytes: 0 %
Lymphocytes Relative: 32 %
Lymphs Abs: 2 10*3/uL (ref 0.7–4.0)
MCH: 29.1 pg (ref 26.0–34.0)
MCHC: 33.9 g/dL (ref 30.0–36.0)
MCV: 85.9 fL (ref 80.0–100.0)
Monocytes Absolute: 0.4 10*3/uL (ref 0.1–1.0)
Monocytes Relative: 7 %
Neutro Abs: 3.6 10*3/uL (ref 1.7–7.7)
Neutrophils Relative %: 58 %
Platelet Count: 262 10*3/uL (ref 150–400)
RBC: 4.88 MIL/uL (ref 3.87–5.11)
RDW: 14.9 % (ref 11.5–15.5)
WBC Count: 6.1 10*3/uL (ref 4.0–10.5)
nRBC: 0 % (ref 0.0–0.2)

## 2022-05-09 LAB — FERRITIN: Ferritin: 21 ng/mL (ref 11–307)

## 2022-05-11 ENCOUNTER — Ambulatory Visit: Payer: BC Managed Care – PPO

## 2022-05-11 ENCOUNTER — Inpatient Hospital Stay (HOSPITAL_BASED_OUTPATIENT_CLINIC_OR_DEPARTMENT_OTHER): Payer: BC Managed Care – PPO | Admitting: Hematology

## 2022-05-11 ENCOUNTER — Encounter: Payer: Self-pay | Admitting: Hematology

## 2022-05-11 DIAGNOSIS — D500 Iron deficiency anemia secondary to blood loss (chronic): Secondary | ICD-10-CM

## 2022-05-11 NOTE — Progress Notes (Signed)
[place]   [phone]) [phone] Fax:(336) [phone]   Clinic Follow up Note   Patient Care Team: Pcp, No as PCP - General Truitt Merle, MD as Consulting Physician (Hematology)  Date of Service:  05/11/2022  I connected with Emma Rios on 05/11/22 at  2:20 PM EDT by telephone and verified that I am speaking with the correct person using two identifiers.   I discussed the limitations, risks, security and privacy concerns of performing an evaluation and management service by telephone and the availability of in person appointments. I also discussed with the patient that there may be a patient responsible charge related to this service. The patient expressed understanding and agreed to proceed.   Patient's location:  Car Provider's location:  Office   CHIEF COMPLAINT: f/u of iron deficient anemia  CURRENT THERAPY:  Surveillance Iv iron if ferritin<10  ASSESSMENT & PLAN:  Emma Rios is a 56 y.o. female with   1. Iron Deficient Anemia, likely GI bleeding from gastritis or diverticulitis -she initially presented to ED on 11/17/21 with head pain triggered by movement. CBC showed hgb 6.4, iron 8, and ferritin 2. Fecal occult blood test was also positive. -colonoscopy/EGD on 11/30/21 with Dr. Noralee Stain at [facility] was negative for malignancy but showed 7 cm hiatal hernia, diffuse gastritis, biopsy was negative for malignancy.  I reviewed her outside records and discussed with patient. -Her iron deficiency is probably related to small GI bleeding, from gastritis or possible related to hiatal hernia or diverticulitis. -she received three doses of IV Venofer in 11/2021.  Anemia resolved. -She received 1 dose of IV iron in July 2023 due to low ferritn (13), no significant improvement of her her mild fatigue. -Continue lab monitoring, will give IV iron if ferritin less than 10   2. H/o Left Breast Cancer in 2007, stage IIA T2, N0, IDC, grade 2 -s/p lumpectomy on 10/16/05, adjuvant chemo  with taxol/cytoxan through 01/2007, adjuvant radiation, and 9 years of tamoxifen through 04/2016.   3. Headaches -Secondary sinus infection, resolved with treatment.   PLAN: -lab every 3 months, will schedule IV iron if ferritin less than 10 -Follow-up in 1 year   No problem-specific Assessment & Plan notes found for this encounter.   INTERVAL HISTORY:  Emma Rios is here for a follow up of IDA. She was last seen by me 3 months ago.  She received 1 dose of IV iron in July 2023, no significant change of her moderate fatigue.  She is clinically doing well, denies any overt bleeding, no chest pain or other discomfort.   All other systems were reviewed with the patient and are negative.  MEDICAL HISTORY:  Past Medical History:  Diagnosis Date   Breast cancer [facility])     SURGICAL HISTORY: Past Surgical History:  Procedure Laterality Date   APPENDECTOMY     CHOLECYSTECTOMY      I have reviewed the social history and family history with the patient and they are unchanged from previous note.  ALLERGIES:  is allergic to codeine.  MEDICATIONS:  Current Outpatient Medications  Medication Sig Dispense Refill   albuterol (VENTOLIN HFA) 108 (90 Base) MCG/ACT inhaler Inhale 2 puffs into the lungs every 6 (six) hours as needed for wheezing or shortness of breath.     amoxicillin-clavulanate (AUGMENTIN) 875-125 MG tablet Take 1 tablet by mouth every 12 (twelve) hours. 14 tablet 0   cetirizine (ZYRTEC) 10 MG tablet Take 10 mg by mouth daily as needed for allergies or  rhinitis.     pantoprazole (PROTONIX) 20 MG tablet Take 1 tablet (20 mg total) by mouth daily. 30 tablet 0   pseudoephedrine (SUDAFED) 30 MG tablet Take 30 mg by mouth every 4 (four) hours as needed for congestion.     No current facility-administered medications for this visit.    PHYSICAL EXAMINATION: ECOG PERFORMANCE STATUS: 0 - Asymptomatic  There were no vitals filed for this visit.  Wt Readings from Last 3  Encounters:  02/01/22 157 lb 11.2 oz (71.5 kg)  12/21/21 156 lb 11.2 oz (71.1 kg)  12/16/21 157 lb 3.2 oz (71.3 kg)    No exam today   LABORATORY DATA:  I have reviewed the data as listed    Latest Ref Rng & Units 05/09/2022    1:51 PM 04/11/2022    1:39 PM 03/06/2022    3:33 PM  CBC  WBC 4.0 - 10.5 K/uL 6.1  6.1  5.5   Hemoglobin 12.0 - 15.0 g/dL 14.2  13.8  13.3   Hematocrit 36.0 - 46.0 % 41.9  41.0  39.9   Platelets 150 - 400 K/uL 262  257  269         Latest Ref Rng & Units 12/16/2021   11:20 AM 12/08/2021    9:19 AM 11/28/2021   12:43 PM  CMP  Glucose 70 - 99 mg/dL 92  103  90   BUN 6 - 20 mg/dL '13  11  16   '$ Creatinine 0.44 - 1.00 mg/dL 0.80  0.92  1.03   Sodium 135 - 145 mmol/L 142  140  139   Potassium 3.5 - 5.1 mmol/L 3.9  3.9  3.6   Chloride 98 - 111 mmol/L 107  107  106   CO2 22 - 32 mmol/L '27  26  25   '$ Calcium 8.9 - 10.3 mg/dL 10.3  9.5  8.8   Total Protein 6.5 - 8.1 g/dL 7.6  7.2  6.7   Total Bilirubin 0.3 - 1.2 mg/dL 0.7  0.4  0.3   Alkaline Phos 38 - 126 U/L 78  75  71   AST 15 - 41 U/L '17  14  14   '$ ALT 0 - 44 U/L '17  12  11       '$ RADIOGRAPHIC STUDIES: I have personally reviewed the radiological images as listed and agreed with the findings in the report. No results found.    I discussed the assessment and treatment plan with the patient. The patient was provided an opportunity to ask questions and all were answered. The patient agreed with the plan and demonstrated an understanding of the instructions.   The patient was advised to call back or seek an in-person evaluation if the symptoms worsen or if the condition fails to improve as anticipated.  I provided 11 minutes of non face-to-face telephone visit time during this encounter, and > 50% was spent counseling as documented under my assessment & plan.    Truitt Merle, MD 05/11/2022

## 2022-05-25 ENCOUNTER — Ambulatory Visit: Payer: BC Managed Care – PPO

## 2022-08-03 ENCOUNTER — Ambulatory Visit: Payer: BC Managed Care – PPO

## 2022-08-09 ENCOUNTER — Other Ambulatory Visit: Payer: Self-pay

## 2022-08-09 ENCOUNTER — Inpatient Hospital Stay: Payer: BC Managed Care – PPO | Attending: Hematology

## 2022-08-09 DIAGNOSIS — K922 Gastrointestinal hemorrhage, unspecified: Secondary | ICD-10-CM | POA: Insufficient documentation

## 2022-08-09 DIAGNOSIS — D500 Iron deficiency anemia secondary to blood loss (chronic): Secondary | ICD-10-CM | POA: Insufficient documentation

## 2022-08-09 DIAGNOSIS — D649 Anemia, unspecified: Secondary | ICD-10-CM

## 2022-08-09 LAB — CBC WITH DIFFERENTIAL (CANCER CENTER ONLY)
Abs Immature Granulocytes: 0.01 10*3/uL (ref 0.00–0.07)
Basophils Absolute: 0 10*3/uL (ref 0.0–0.1)
Basophils Relative: 1 %
Eosinophils Absolute: 0.2 10*3/uL (ref 0.0–0.5)
Eosinophils Relative: 3 %
HCT: 40.6 % (ref 36.0–46.0)
Hemoglobin: 13.8 g/dL (ref 12.0–15.0)
Immature Granulocytes: 0 %
Lymphocytes Relative: 37 %
Lymphs Abs: 1.7 10*3/uL (ref 0.7–4.0)
MCH: 29.1 pg (ref 26.0–34.0)
MCHC: 34 g/dL (ref 30.0–36.0)
MCV: 85.5 fL (ref 80.0–100.0)
Monocytes Absolute: 0.5 10*3/uL (ref 0.1–1.0)
Monocytes Relative: 10 %
Neutro Abs: 2.3 10*3/uL (ref 1.7–7.7)
Neutrophils Relative %: 49 %
Platelet Count: 242 10*3/uL (ref 150–400)
RBC: 4.75 MIL/uL (ref 3.87–5.11)
RDW: 12.5 % (ref 11.5–15.5)
WBC Count: 4.7 10*3/uL (ref 4.0–10.5)
nRBC: 0 % (ref 0.0–0.2)

## 2022-08-09 LAB — IRON AND IRON BINDING CAPACITY (CC-WL,HP ONLY)
Iron: 34 ug/dL (ref 28–170)
Saturation Ratios: 9 % — ABNORMAL LOW (ref 10.4–31.8)
TIBC: 386 ug/dL (ref 250–450)
UIBC: 352 ug/dL

## 2022-08-10 LAB — FERRITIN: Ferritin: 39 ng/mL (ref 11–307)

## 2022-08-11 ENCOUNTER — Inpatient Hospital Stay: Payer: BC Managed Care – PPO

## 2022-08-13 ENCOUNTER — Encounter: Payer: Self-pay | Admitting: Hematology

## 2022-09-01 ENCOUNTER — Other Ambulatory Visit: Payer: Self-pay

## 2022-09-01 ENCOUNTER — Emergency Department (HOSPITAL_COMMUNITY): Payer: BC Managed Care – PPO

## 2022-09-01 ENCOUNTER — Encounter (HOSPITAL_COMMUNITY): Payer: Self-pay

## 2022-09-01 ENCOUNTER — Emergency Department (HOSPITAL_COMMUNITY)
Admission: EM | Admit: 2022-09-01 | Discharge: 2022-09-01 | Disposition: A | Discharge status: Home / Self Care | Payer: BC Managed Care – PPO | Attending: Emergency Medicine | Admitting: Emergency Medicine

## 2022-09-01 DIAGNOSIS — M545 Low back pain, unspecified: Secondary | ICD-10-CM | POA: Diagnosis present

## 2022-09-01 DIAGNOSIS — M5442 Lumbago with sciatica, left side: Secondary | ICD-10-CM | POA: Diagnosis not present

## 2022-09-01 DIAGNOSIS — M5441 Lumbago with sciatica, right side: Secondary | ICD-10-CM | POA: Diagnosis not present

## 2022-09-01 DIAGNOSIS — Z853 Personal history of malignant neoplasm of breast: Secondary | ICD-10-CM | POA: Insufficient documentation

## 2022-09-01 DIAGNOSIS — R000 Tachycardia, unspecified: Secondary | ICD-10-CM | POA: Insufficient documentation

## 2022-09-01 LAB — BASIC METABOLIC PANEL
Anion gap: 12 (ref 5–15)
BUN: 15 mg/dL (ref 6–20)
CO2: 22 mmol/L (ref 22–32)
Calcium: 9.5 mg/dL (ref 8.9–10.3)
Chloride: 104 mmol/L (ref 98–111)
Creatinine, Ser: 0.96 mg/dL (ref 0.44–1.00)
GFR, Estimated: >60 mL/min (ref >60)
Glucose, Bld: 92 mg/dL (ref 70–99)
Potassium: 3.6 mmol/L (ref 3.5–5.1)
Sodium: 138 mmol/L (ref 135–145)

## 2022-09-01 LAB — CBC
HCT: 44.6 % (ref 36.0–46.0)
Hemoglobin: 14.7 g/dL (ref 12.0–15.0)
MCH: 28.4 pg (ref 26.0–34.0)
MCHC: 33 g/dL (ref 30.0–36.0)
MCV: 86.3 fL (ref 80.0–100.0)
Platelets: 282 10*3/uL (ref 150–400)
RBC: 5.17 MIL/uL — ABNORMAL HIGH (ref 3.87–5.11)
RDW: 12.7 % (ref 11.5–15.5)
WBC: 4.8 10*3/uL (ref 4.0–10.5)
nRBC: 0 % (ref 0.0–0.2)

## 2022-09-01 MED ORDER — METHYLPREDNISOLONE 4 MG PO TBPK: ORAL_TABLET | ORAL | 0 refills | Status: AC

## 2022-09-01 MED ORDER — OXYCODONE-ACETAMINOPHEN 5-325 MG PO TABS: 1.0000 | ORAL_TABLET | Freq: Four times a day (QID) | ORAL | 0 refills | Status: DC | PRN

## 2022-09-01 MED ORDER — METHOCARBAMOL 500 MG PO TABS
500.0000 mg | ORAL_TABLET | Freq: Once | ORAL | Status: AC
  Administered 2022-09-01: 500 mg via ORAL
  Filled 2022-09-01: qty 1

## 2022-09-01 MED ORDER — GADOBUTROL 1 MMOL/ML IV SOLN
7.0000 mL | Freq: Once | INTRAVENOUS | Status: AC | PRN
  Administered 2022-09-01: 7 mL via INTRAVENOUS

## 2022-09-01 MED ORDER — KETOROLAC TROMETHAMINE 15 MG/ML IJ SOLN
15.0000 mg | Freq: Once | INTRAMUSCULAR | Status: AC
  Administered 2022-09-01: 15 mg via INTRAVENOUS
  Filled 2022-09-01: qty 1

## 2022-09-01 NOTE — ED Notes (Signed)
Called [facility] to let them know patient was ready for transport

## 2022-09-01 NOTE — ED Notes (Signed)
Patient up to restroom.

## 2022-09-01 NOTE — ED Provider Notes (Signed)
[facility] Provider Note   CSN: [id number] Arrival date & time: 09/01/22  [id number]     History  Chief Complaint  Patient presents with   Back Pain    Emma Rios is a 57 y.o. female.  Years   Back Pain Patient presents back pain.  Has had for the last few days.  Does work at [employer] and does lift things but no specific injury.  Worst on the left leg.  Difficulty with ambulation.  No loss of bladder or bowel control.  No fevers.  No trauma but does have a cancer history.  Cancer free however as far she knows.    Past Medical History:  Diagnosis Date   Breast cancer ([place])     Home Medications Prior to Admission medications   Medication Sig Start Date End Date Taking? Authorizing Provider  methylPREDNISolone (MEDROL DOSEPAK) 4 MG TBPK tablet Use per box directions. 09/01/22  Yes Davonna Belling, MD  oxyCODONE-acetaminophen (PERCOCET/ROXICET) 5-325 MG tablet Take 1-2 tablets by mouth every 6 (six) hours as needed for severe pain. 09/01/22  Yes Davonna Belling, MD  albuterol (VENTOLIN HFA) 108 (90 Base) MCG/ACT inhaler Inhale 2 puffs into the lungs every 6 (six) hours as needed for wheezing or shortness of breath. 10/07/21   [provider]  amoxicillin-clavulanate (AUGMENTIN) 875-125 MG tablet Take 1 tablet by mouth every 12 (twelve) hours. 12/21/21   Benay Pike, MD  cetirizine (ZYRTEC) 10 MG tablet Take 10 mg by mouth daily as needed for allergies or rhinitis.    [provider]  pantoprazole (PROTONIX) 20 MG tablet Take 1 tablet (20 mg total) by mouth daily. 11/17/21   Charlesetta Shanks, MD  pseudoephedrine (SUDAFED) 30 MG tablet Take 30 mg by mouth every 4 (four) hours as needed for congestion.    [provider]      Allergies    Codeine    Review of Systems   Review of Systems  Musculoskeletal:  Positive for back pain.    Physical Exam Updated Vital Signs BP (!) 116/95   Pulse 71   Temp 97.9  F (36.6 C)   Resp 18   Ht 5' 6.5" (1.689 m)   Wt 71.2 kg   LMP  (LMP Unknown)   SpO2 95%   BMI 24.96 kg/m  Physical Exam Vitals and nursing note reviewed.  HENT:     Head: Atraumatic.  Cardiovascular:     Rate and Rhythm: Regular rhythm. Tachycardia present.  Abdominal:     Tenderness: There is no abdominal tenderness.  Musculoskeletal:        General: Tenderness present.     Comments: Moderate lumbar paraspinal tenderness.  Moderate pain with straight leg raise bilateral.  Does have clonus at ankle, particular right.  Sensation grossly intact in lower extremities.  Neurological:     Mental Status: She is alert.     ED Results / Procedures / Treatments   Labs (all labs ordered are listed, but only abnormal results are displayed) Labs Reviewed  CBC - Abnormal; Notable for the following components:      Result Value   RBC 5.17 (*)    All other components within normal limits  BASIC METABOLIC PANEL    EKG None  Radiology MR Lumbar Spine W Wo Contrast  Result Date: 09/01/2022 CLINICAL DATA:  Low back and left leg pain. Cancer suspected. History of breast cancer. EXAM: MRI LUMBAR SPINE WITHOUT AND WITH CONTRAST TECHNIQUE: Multiplanar  and multiecho pulse sequences of the lumbar spine were obtained without and with intravenous contrast. CONTRAST:  79m GADAVIST GADOBUTROL 1 MMOL/ML IV SOLN COMPARISON:  None Available. FINDINGS: Segmentation: Conventional anatomy assumed, with the last open disc space designated L5-S1. Alignment:  Physiologic. Vertebrae: No worrisome osseous lesion, acute fracture or pars defect. There is no evidence of osseous metastatic disease. Small hemangioma or fatty deposit in the L2 vertebral body. Mild sacroiliac degenerative changes bilaterally. Conus medullaris: Extends to the L1-2 level and appears normal. No abnormal intradural enhancement. Paraspinal and other soft tissues: Pseudo bursal formation between the L3 and L4 spinous processes with  surrounding soft tissue enhancement following contrast. No other significant paraspinal findings. Disc levels: No significant disc space findings from T11-12 through L2-3. Mild disc desiccation at L2-3. L3-4: Disc desiccation with preserved height. Minimal facet hypertrophy. No spinal stenosis or nerve root encroachment. 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 L4-5: Disc desiccation with preserved height and mild disc bulging. Mild facet and ligamentous hypertrophy. The spinal canal and foramina are widely patent. L5-S1: Normal interspace. IMPRESSION: 1. No evidence of osseous metastatic disease. 2. Mild disc degeneration and facet hypertrophy as described. No disc herniation, spinal stenosis or nerve root encroachment. 3. 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 These findings may contribute to localized back pain. Electronically Signed   By: WRichardean SaleM.D.   On: 09/01/2022 14:13    Procedures Procedures    Medications Ordered in ED Medications  ketorolac (TORADOL) 15 MG/ML injection 15 mg (15 mg Intravenous Given 09/01/22 0846)  methocarbamol (ROBAXIN) tablet 500 mg (500 mg Oral Given 09/01/22 0846)  gadobutrol (GADAVIST) 1 MMOL/ML injection 7 mL (7 mLs Intravenous Contrast Given 09/01/22 1350)    ED Course/ Medical Decision Making/ A&P                             Medical Decision Making Amount and/or Complexity of Data Reviewed Labs: ordered. Radiology: ordered.  Risk Prescription drug management.  Patient with low back pain.  Has had previous episode that resolved but worse today.  Very tender.  Worse with movements.  Worse with straight leg raise.  However does have clonus in the lower extremities.  With a history of cancer will get MRI to further evaluate.  Will treat symptomatically right now with Robaxin and Toradol.   feeling somewhat better.  X-ray reassuring.  Outpatient follow-up.  Neurosurgery  follow-up as needed.  Can also follow-up with PCP.           Final Clinical Impression(s) / ED Diagnoses Final diagnoses:  Acute midline low back pain with bilateral sciatica    Rx / DC Orders ED Discharge Orders          Ordered    methylPREDNISolone (MEDROL DOSEPAK) 4 MG TBPK tablet        09/01/22 1433    oxyCODONE-acetaminophen (PERCOCET/ROXICET) 5-325 MG tablet  Every 6 hours PRN        09/01/22 1433              PDavonna Belling MD 09/01/22 1538

## 2022-09-01 NOTE — ED Triage Notes (Signed)
Patient reports she works for [employer] and does a lot of lifting and moving things and started having back pain yesterday but it is not excruciating in the center of her lower back.  Reports pain down legs more on the left. No bowel or bladder incontinence.

## 2022-11-10 ENCOUNTER — Inpatient Hospital Stay: Payer: BC Managed Care – PPO

## 2022-11-10 ENCOUNTER — Telehealth: Payer: Self-pay | Admitting: Hematology

## 2022-11-10 NOTE — Telephone Encounter (Signed)
Patient left voicemail to reschedule lab appointment for today, reached out to patient to verify time and date change.

## 2022-11-14 ENCOUNTER — Other Ambulatory Visit: Payer: BC Managed Care – PPO

## 2022-11-16 ENCOUNTER — Other Ambulatory Visit: Payer: Self-pay

## 2022-11-16 ENCOUNTER — Inpatient Hospital Stay: Payer: BC Managed Care – PPO | Attending: Hematology

## 2022-11-16 DIAGNOSIS — Z79899 Other long term (current) drug therapy: Secondary | ICD-10-CM | POA: Insufficient documentation

## 2022-11-16 DIAGNOSIS — D500 Iron deficiency anemia secondary to blood loss (chronic): Secondary | ICD-10-CM | POA: Diagnosis present

## 2022-11-16 DIAGNOSIS — D649 Anemia, unspecified: Secondary | ICD-10-CM

## 2022-11-16 DIAGNOSIS — Z853 Personal history of malignant neoplasm of breast: Secondary | ICD-10-CM | POA: Diagnosis not present

## 2022-11-16 DIAGNOSIS — K922 Gastrointestinal hemorrhage, unspecified: Secondary | ICD-10-CM | POA: Diagnosis not present

## 2022-11-16 LAB — CBC WITH DIFFERENTIAL (CANCER CENTER ONLY)
Abs Immature Granulocytes: 0.01 10*3/uL (ref 0.00–0.07)
Basophils Absolute: 0.1 10*3/uL (ref 0.0–0.1)
Basophils Relative: 1 %
Eosinophils Absolute: 0.1 10*3/uL (ref 0.0–0.5)
Eosinophils Relative: 1 %
HCT: 39.9 % (ref 36.0–46.0)
Hemoglobin: 13.5 g/dL (ref 12.0–15.0)
Immature Granulocytes: 0 %
Lymphocytes Relative: 37 %
Lymphs Abs: 2.1 10*3/uL (ref 0.7–4.0)
MCH: 28.4 pg (ref 26.0–34.0)
MCHC: 33.8 g/dL (ref 30.0–36.0)
MCV: 84 fL (ref 80.0–100.0)
Monocytes Absolute: 0.5 10*3/uL (ref 0.1–1.0)
Monocytes Relative: 8 %
Neutro Abs: 3.1 10*3/uL (ref 1.7–7.7)
Neutrophils Relative %: 53 %
Platelet Count: 301 10*3/uL (ref 150–400)
RBC: 4.75 MIL/uL (ref 3.87–5.11)
RDW: 13.2 % (ref 11.5–15.5)
WBC Count: 5.8 10*3/uL (ref 4.0–10.5)
nRBC: 0 % (ref 0.0–0.2)

## 2022-11-16 LAB — IRON AND IRON BINDING CAPACITY (CC-WL,HP ONLY)
Iron: 99 ug/dL (ref 28–170)
Saturation Ratios: 22 % (ref 10.4–31.8)
TIBC: 444 ug/dL (ref 250–450)
UIBC: 345 ug/dL (ref 148–442)

## 2022-11-16 LAB — FERRITIN: Ferritin: 9 ng/mL — ABNORMAL LOW (ref 11–307)

## 2022-11-20 ENCOUNTER — Telehealth: Payer: Self-pay

## 2022-11-20 NOTE — Telephone Encounter (Addendum)
Called patient to relay message below as per Dr. Mosetta Putt, patient voiced understanding, sent a message to [facility] to call patient and schedule IV venofer for her.    ----- Message from Malachy Mood, MD sent at 11/19/2022  8:58 PM EDT ----- Please let pt know her iron level is low, and I recommend iv venofer 300mg  weekly X2, please schedule if she agrees, thanks   [employer]

## 2022-11-21 ENCOUNTER — Telehealth: Payer: Self-pay | Admitting: Hematology

## 2022-11-21 NOTE — Telephone Encounter (Signed)
Contacted patient to schedule iron transfusions per staff message from provider. Patient advised that she only wanted to schedule one at this time, and will schedule the next one when she comes into office.

## 2022-11-27 ENCOUNTER — Telehealth: Payer: Self-pay | Admitting: Hematology

## 2022-12-04 ENCOUNTER — Inpatient Hospital Stay: Payer: BC Managed Care – PPO

## 2022-12-07 ENCOUNTER — Inpatient Hospital Stay: Payer: BC Managed Care – PPO

## 2022-12-07 VITALS — BP 127/82 | HR 97 | Temp 98.7°F | Resp 16

## 2022-12-07 DIAGNOSIS — D500 Iron deficiency anemia secondary to blood loss (chronic): Secondary | ICD-10-CM

## 2022-12-07 MED ORDER — SODIUM CHLORIDE 0.9 % IV SOLN
300.0000 mg | Freq: Once | INTRAVENOUS | Status: AC
  Administered 2022-12-07: 300 mg via INTRAVENOUS
  Filled 2022-12-07: qty 15

## 2022-12-07 MED ORDER — SODIUM CHLORIDE 0.9 % IV SOLN: Freq: Once | INTRAVENOUS | Status: AC

## 2022-12-07 MED ORDER — LORATADINE 10 MG PO TABS: 10.0000 mg | ORAL_TABLET | Freq: Every day | ORAL | Status: DC

## 2022-12-07 NOTE — Patient Instructions (Signed)

## 2023-01-30 ENCOUNTER — Encounter: Payer: Self-pay | Admitting: Hematology

## 2023-02-05 ENCOUNTER — Inpatient Hospital Stay: Payer: BC Managed Care – PPO | Attending: Hematology

## 2023-02-05 ENCOUNTER — Inpatient Hospital Stay: Payer: BC Managed Care – PPO

## 2023-02-05 ENCOUNTER — Other Ambulatory Visit: Payer: Self-pay

## 2023-02-05 DIAGNOSIS — D649 Anemia, unspecified: Secondary | ICD-10-CM

## 2023-02-05 DIAGNOSIS — Z79899 Other long term (current) drug therapy: Secondary | ICD-10-CM | POA: Diagnosis not present

## 2023-02-05 DIAGNOSIS — D500 Iron deficiency anemia secondary to blood loss (chronic): Secondary | ICD-10-CM

## 2023-02-05 DIAGNOSIS — D508 Other iron deficiency anemias: Secondary | ICD-10-CM | POA: Insufficient documentation

## 2023-02-05 LAB — CMP (CANCER CENTER ONLY)
ALT: 15 U/L (ref 0–44)
AST: 13 U/L — ABNORMAL LOW (ref 15–41)
Albumin: 4.1 g/dL (ref 3.5–5.0)
Alkaline Phosphatase: 67 U/L (ref 38–126)
Anion gap: 8 (ref 5–15)
BUN: 12 mg/dL (ref 6–20)
CO2: 28 mmol/L (ref 22–32)
Calcium: 9.1 mg/dL (ref 8.9–10.3)
Chloride: 105 mmol/L (ref 98–111)
Creatinine: 1.03 mg/dL — ABNORMAL HIGH (ref 0.44–1.00)
GFR, Estimated: >60 mL/min (ref >60)
Glucose, Bld: 94 mg/dL (ref 70–99)
Potassium: 3.5 mmol/L (ref 3.5–5.1)
Sodium: 141 mmol/L (ref 135–145)
Total Bilirubin: 0.4 mg/dL (ref 0.3–1.2)
Total Protein: 6.5 g/dL (ref 6.5–8.1)

## 2023-02-05 LAB — CBC WITH DIFFERENTIAL (CANCER CENTER ONLY)
Abs Immature Granulocytes: 0.03 10*3/uL (ref 0.00–0.07)
Basophils Absolute: 0 10*3/uL (ref 0.0–0.1)
Basophils Relative: 0 %
Eosinophils Absolute: 0.1 10*3/uL (ref 0.0–0.5)
Eosinophils Relative: 1 %
HCT: 39.8 % (ref 36.0–46.0)
Hemoglobin: 13.5 g/dL (ref 12.0–15.0)
Immature Granulocytes: 0 %
Lymphocytes Relative: 38 %
Lymphs Abs: 3.4 10*3/uL (ref 0.7–4.0)
MCH: 29.2 pg (ref 26.0–34.0)
MCHC: 33.9 g/dL (ref 30.0–36.0)
MCV: 86 fL (ref 80.0–100.0)
Monocytes Absolute: 0.8 10*3/uL (ref 0.1–1.0)
Monocytes Relative: 9 %
Neutro Abs: 4.6 10*3/uL (ref 1.7–7.7)
Neutrophils Relative %: 52 %
Platelet Count: 268 10*3/uL (ref 150–400)
RBC: 4.63 MIL/uL (ref 3.87–5.11)
RDW: 14.3 % (ref 11.5–15.5)
WBC Count: 9 10*3/uL (ref 4.0–10.5)
nRBC: 0 % (ref 0.0–0.2)

## 2023-02-05 LAB — IRON AND IRON BINDING CAPACITY (CC-WL,HP ONLY)
Iron: 83 ug/dL (ref 28–170)
Saturation Ratios: 24 % (ref 10.4–31.8)
TIBC: 347 ug/dL (ref 250–450)
UIBC: 264 ug/dL (ref 148–442)

## 2023-02-06 LAB — FERRITIN: Ferritin: 21 ng/mL (ref 11–307)

## 2023-02-09 ENCOUNTER — Inpatient Hospital Stay: Payer: BC Managed Care – PPO

## 2023-02-16 ENCOUNTER — Encounter: Payer: Self-pay | Admitting: Nurse Practitioner

## 2023-05-10 ENCOUNTER — Encounter: Payer: Self-pay | Admitting: Hematology

## 2023-05-10 ENCOUNTER — Other Ambulatory Visit: Payer: Self-pay

## 2023-05-10 ENCOUNTER — Inpatient Hospital Stay: Payer: BC Managed Care – PPO | Attending: Hematology

## 2023-05-10 DIAGNOSIS — Z79899 Other long term (current) drug therapy: Secondary | ICD-10-CM | POA: Diagnosis not present

## 2023-05-10 DIAGNOSIS — D500 Iron deficiency anemia secondary to blood loss (chronic): Secondary | ICD-10-CM

## 2023-05-10 DIAGNOSIS — D508 Other iron deficiency anemias: Secondary | ICD-10-CM | POA: Diagnosis present

## 2023-05-10 DIAGNOSIS — D649 Anemia, unspecified: Secondary | ICD-10-CM

## 2023-05-10 LAB — CMP (CANCER CENTER ONLY)
ALT: 15 U/L (ref 0–44)
AST: 16 U/L (ref 15–41)
Albumin: 4.4 g/dL (ref 3.5–5.0)
Alkaline Phosphatase: 65 U/L (ref 38–126)
Anion gap: 10 (ref 5–15)
BUN: 15 mg/dL (ref 6–20)
CO2: 25 mmol/L (ref 22–32)
Calcium: 9.6 mg/dL (ref 8.9–10.3)
Chloride: 102 mmol/L (ref 98–111)
Creatinine: 0.9 mg/dL (ref 0.44–1.00)
GFR, Estimated: >60 mL/min (ref >60)
Glucose, Bld: 79 mg/dL (ref 70–99)
Potassium: 3.6 mmol/L (ref 3.5–5.1)
Sodium: 137 mmol/L (ref 135–145)
Total Bilirubin: 1 mg/dL (ref 0.3–1.2)
Total Protein: 7.7 g/dL (ref 6.5–8.1)

## 2023-05-10 LAB — CBC WITH DIFFERENTIAL (CANCER CENTER ONLY)
Abs Immature Granulocytes: 0.01 10*3/uL (ref 0.00–0.07)
Basophils Absolute: 0 10*3/uL (ref 0.0–0.1)
Basophils Relative: 1 %
Eosinophils Absolute: 0.4 10*3/uL (ref 0.0–0.5)
Eosinophils Relative: 8 %
HCT: 43 % (ref 36.0–46.0)
Hemoglobin: 14.5 g/dL (ref 12.0–15.0)
Immature Granulocytes: 0 %
Lymphocytes Relative: 37 %
Lymphs Abs: 1.7 10*3/uL (ref 0.7–4.0)
MCH: 29.4 pg (ref 26.0–34.0)
MCHC: 33.7 g/dL (ref 30.0–36.0)
MCV: 87.2 fL (ref 80.0–100.0)
Monocytes Absolute: 0.4 10*3/uL (ref 0.1–1.0)
Monocytes Relative: 9 %
Neutro Abs: 2.1 10*3/uL (ref 1.7–7.7)
Neutrophils Relative %: 45 %
Platelet Count: 261 10*3/uL (ref 150–400)
RBC: 4.93 MIL/uL (ref 3.87–5.11)
RDW: 13.1 % (ref 11.5–15.5)
WBC Count: 4.7 10*3/uL (ref 4.0–10.5)
nRBC: 0 % (ref 0.0–0.2)

## 2023-05-10 LAB — FERRITIN: Ferritin: 31 ng/mL (ref 11–307)

## 2023-05-10 LAB — IRON AND IRON BINDING CAPACITY (CC-WL,HP ONLY)
Iron: 104 ug/dL (ref 28–170)
Saturation Ratios: 28 % (ref 10.4–31.8)
TIBC: 374 ug/dL (ref 250–450)
UIBC: 270 ug/dL

## 2023-05-14 ENCOUNTER — Inpatient Hospital Stay: Payer: BC Managed Care – PPO

## 2023-05-14 ENCOUNTER — Inpatient Hospital Stay: Payer: BC Managed Care – PPO | Admitting: Hematology

## 2023-06-06 NOTE — Assessment & Plan Note (Signed)
likely GI bleeding from gastritis or diverticulitis -she initially presented to ED on 11/17/21 with head pain triggered by movement. CBC showed hgb 6.4, iron 8, and ferritin 2. Fecal occult blood test was also positive. -colonoscopy/EGD on 11/30/21 with Dr. Iona Coach at [facility] was negative for malignancy but showed 7 cm hiatal hernia, diffuse gastritis, biopsy was negative for malignancy.   -Her iron deficiency is probably related to small GI bleeding, from gastritis or possible related to hiatal hernia or diverticulitis. -she received three doses of IV Venofer in 11/2021.  Anemia resolved.

## 2023-06-07 ENCOUNTER — Inpatient Hospital Stay: Payer: BC Managed Care – PPO | Attending: Hematology | Admitting: Hematology

## 2023-06-07 ENCOUNTER — Inpatient Hospital Stay: Payer: BC Managed Care – PPO

## 2023-06-07 VITALS — BP 128/96 | HR 88 | Temp 98.1°F | Resp 18 | Ht 66.5 in | Wt 156.1 lb

## 2023-06-07 DIAGNOSIS — Z8719 Personal history of other diseases of the digestive system: Secondary | ICD-10-CM | POA: Insufficient documentation

## 2023-06-07 DIAGNOSIS — M549 Dorsalgia, unspecified: Secondary | ICD-10-CM | POA: Diagnosis not present

## 2023-06-07 DIAGNOSIS — Z79899 Other long term (current) drug therapy: Secondary | ICD-10-CM | POA: Diagnosis not present

## 2023-06-07 DIAGNOSIS — Z885 Allergy status to narcotic agent status: Secondary | ICD-10-CM | POA: Insufficient documentation

## 2023-06-07 DIAGNOSIS — G43909 Migraine, unspecified, not intractable, without status migrainosus: Secondary | ICD-10-CM | POA: Diagnosis not present

## 2023-06-07 DIAGNOSIS — D500 Iron deficiency anemia secondary to blood loss (chronic): Secondary | ICD-10-CM

## 2023-06-07 DIAGNOSIS — Z1231 Encounter for screening mammogram for malignant neoplasm of breast: Secondary | ICD-10-CM | POA: Insufficient documentation

## 2023-06-07 DIAGNOSIS — R5383 Other fatigue: Secondary | ICD-10-CM | POA: Insufficient documentation

## 2023-06-07 DIAGNOSIS — Z9049 Acquired absence of other specified parts of digestive tract: Secondary | ICD-10-CM | POA: Diagnosis not present

## 2023-06-07 DIAGNOSIS — D508 Other iron deficiency anemias: Secondary | ICD-10-CM | POA: Insufficient documentation

## 2023-06-07 DIAGNOSIS — I10 Essential (primary) hypertension: Secondary | ICD-10-CM | POA: Insufficient documentation

## 2023-06-07 DIAGNOSIS — E2839 Other primary ovarian failure: Secondary | ICD-10-CM | POA: Diagnosis not present

## 2023-06-07 DIAGNOSIS — D649 Anemia, unspecified: Secondary | ICD-10-CM

## 2023-06-07 LAB — CBC WITH DIFFERENTIAL (CANCER CENTER ONLY)
Abs Immature Granulocytes: 0.01 10*3/uL (ref 0.00–0.07)
Basophils Absolute: 0.1 10*3/uL (ref 0.0–0.1)
Basophils Relative: 1 %
Eosinophils Absolute: 0.3 10*3/uL (ref 0.0–0.5)
Eosinophils Relative: 7 %
HCT: 44.5 % (ref 36.0–46.0)
Hemoglobin: 14.6 g/dL (ref 12.0–15.0)
Immature Granulocytes: 0 %
Lymphocytes Relative: 38 %
Lymphs Abs: 1.8 10*3/uL (ref 0.7–4.0)
MCH: 29.4 pg (ref 26.0–34.0)
MCHC: 32.8 g/dL (ref 30.0–36.0)
MCV: 89.7 fL (ref 80.0–100.0)
Monocytes Absolute: 0.4 10*3/uL (ref 0.1–1.0)
Monocytes Relative: 9 %
Neutro Abs: 2.1 10*3/uL (ref 1.7–7.7)
Neutrophils Relative %: 45 %
Platelet Count: 265 10*3/uL (ref 150–400)
RBC: 4.96 MIL/uL (ref 3.87–5.11)
RDW: 12.9 % (ref 11.5–15.5)
WBC Count: 4.6 10*3/uL (ref 4.0–10.5)
nRBC: 0 % (ref 0.0–0.2)

## 2023-06-07 LAB — CMP (CANCER CENTER ONLY)
ALT: 21 U/L (ref 0–44)
AST: 19 U/L (ref 15–41)
Albumin: 4.6 g/dL (ref 3.5–5.0)
Alkaline Phosphatase: 80 U/L (ref 38–126)
Anion gap: 8 (ref 5–15)
BUN: 15 mg/dL (ref 6–20)
CO2: 27 mmol/L (ref 22–32)
Calcium: 10 mg/dL (ref 8.9–10.3)
Chloride: 104 mmol/L (ref 98–111)
Creatinine: 0.92 mg/dL (ref 0.44–1.00)
GFR, Estimated: >60 mL/min (ref >60)
Glucose, Bld: 94 mg/dL (ref 70–99)
Potassium: 3.9 mmol/L (ref 3.5–5.1)
Sodium: 139 mmol/L (ref 135–145)
Total Bilirubin: 0.7 mg/dL (ref <1.2)
Total Protein: 7.6 g/dL (ref 6.5–8.1)

## 2023-06-07 LAB — IRON AND IRON BINDING CAPACITY (CC-WL,HP ONLY)
Iron: 82 ug/dL (ref 28–170)
Saturation Ratios: 23 % (ref 10.4–31.8)
TIBC: 360 ug/dL (ref 250–450)
UIBC: 278 ug/dL (ref 148–442)

## 2023-06-07 LAB — FERRITIN: Ferritin: 42 ng/mL (ref 11–307)

## 2023-06-07 MED ORDER — SUMATRIPTAN SUCCINATE 100 MG PO TABS: 100.0000 mg | ORAL_TABLET | ORAL | 0 refills | Status: AC | PRN

## 2023-06-07 NOTE — Progress Notes (Signed)
[facility]   Telephone:([phone] Fax:([phone]   Clinic Follow up Note   Patient Care Team: Pcp, No as PCP - General Malachy Mood, MD as Consulting Physician (Hematology)  Date of Service:  06/07/2023  CHIEF COMPLAINT: f/u of anemia and iron deficiency  CURRENT THERAPY:  IV iron as needed, will give if ferritin less than 50  Oncology History   Iron deficiency anemia due to chronic blood loss likely GI bleeding from gastritis or diverticulitis -she initially presented to ED on 11/17/21 with head pain triggered by movement. CBC showed hgb 6.4, iron 8, and ferritin 2. Fecal occult blood test was also positive. -colonoscopy/EGD on 11/30/21 with Dr. Iona Coach at [facility] was negative for malignancy but showed 7 cm hiatal hernia, diffuse gastritis, biopsy was negative for malignancy.   -Her iron deficiency is probably related to small GI bleeding, from gastritis or possible related to hiatal hernia or diverticulitis. -she received three doses of IV Venofer in 11/2021.  Anemia resolved.    Assessment and Plan    Iron Deficiency Anemia Iron deficiency anemia with intermittent severe fatigue. Blood counts are normal; ferritin levels pending. Previous IV iron infusion in May showed improvement. Reports irregular oral iron intake without vitamin C, affecting absorption. Discussed benefits of maintaining ferritin above 50 due to her fatigue.   - Check ferritin levels - Consider IV iron infusion if ferritin is below 50 - Advise taking oral iron with vitamin C to improve absorption - Schedule follow-up lab work every three months - Monitor symptoms and adjust treatment as needed  Hypertension Elevated blood pressure (128/96). Lacks primary care physician for regular monitoring and management. - Advise home blood pressure monitoring - Recommend follow-up with a primary care physician for ongoing management   Migraine Intermittent migraines with vomiting, effectively managed  with sumatriptan 100 mg. - Prescribe sumatriptan 100 mg tablets, 10 tablets, as needed for migraines  General Health Maintenance Lacks primary care physician for routine health maintenance. Mammogram due March 2025, bone density scan due now, Pap smear needed. - Recommend annual mammogram, next due March 2025, I ordered for her - Order bone density scan, due now - Advise follow-up with OB/GYN for Pap smear and other screenings - Encourage finding a primary care physician for regular health maintenance  Plan -Lab reviewed, ferritin still pending, will give IV iron if less than 50 - Schedule follow-up lab work every three months - Follow-up visit in nine months -I give her her paper order of mammogram and bone density scan, she will do in [employer]       Discussed the use of AI scribe software for clinical note transcription with the patient, who gave verbal consent to proceed.  History of Present Illness   Emma Rios, a 57 year old female with a history of iron deficient anemia and breast cancer, presents for a follow-up visit. She reports intermittent episodes of extreme fatigue and weakness, describing them as feeling "like you can't even go another step." These episodes are so severe that she has expressed to her husband that she wouldn't be surprised if she "dropped dead." She has tried to correlate these episodes with her anemia, but her blood work has consistently returned normal results.  Emma Rios also has a history of breast cancer, for which she was treated with tamoxifen from the age of 37. She has been diligent about her mammograms, scheduling them annually. Her last mammogram was in March of this year.  In addition to her anemia and  history of breast cancer, Emma Rios experiences occasional migraines. She describes these as infrequent, occurring only a few times a year, but severe enough to cause vomiting. She has been managing these episodes with sumatriptan 100mg  as  needed.  Emma Rios also mentions a past issue with back pain, which was severe enough to limit her mobility. She received an injection in March, which she reports has significantly improved her symptoms. She does not currently take any prescription medications regularly.         All other systems were reviewed with the patient and are negative.  MEDICAL HISTORY:  Past Medical History:  Diagnosis Date   Breast cancer [facility])     SURGICAL HISTORY: Past Surgical History:  Procedure Laterality Date   APPENDECTOMY     CHOLECYSTECTOMY      I have reviewed the social history and family history with the patient and they are unchanged from previous note.  ALLERGIES:  is allergic to codeine.  MEDICATIONS:  Current Outpatient Medications  Medication Sig Dispense Refill   SUMAtriptan (IMITREX) 100 MG tablet Take 1 tablet (100 mg total) by mouth every 2 (two) hours as needed for migraine. May repeat in 2 hours if headache persists or recurs. 10 tablet 0   albuterol (VENTOLIN HFA) 108 (90 Base) MCG/ACT inhaler Inhale 2 puffs into the lungs every 6 (six) hours as needed for wheezing or shortness of breath.     cetirizine (ZYRTEC) 10 MG tablet Take 10 mg by mouth daily as needed for allergies or rhinitis.     methylPREDNISolone (MEDROL DOSEPAK) 4 MG TBPK tablet Use per box directions. 21 each 0   pantoprazole (PROTONIX) 20 MG tablet Take 1 tablet (20 mg total) by mouth daily. 30 tablet 0   pseudoephedrine (SUDAFED) 30 MG tablet Take 30 mg by mouth every 4 (four) hours as needed for congestion.     No current facility-administered medications for this visit.    PHYSICAL EXAMINATION: ECOG PERFORMANCE STATUS: 0 - Asymptomatic  Vitals:   06/07/23 0849  BP: (!) 128/96  Pulse: 88  Resp: 18  Temp: 98.1 F (36.7 C)  SpO2: 99%   Wt Readings from Last 3 Encounters:  06/07/23 156 lb 1.6 oz (70.8 kg)  09/01/22 157 lb (71.2 kg)  02/01/22 157 lb 11.2 oz (71.5 kg)     GENERAL:alert, no distress  and comfortable SKIN: skin color, texture, turgor are normal, no rashes or significant lesions EYES: normal, Conjunctiva are pink and non-injected, sclera clear NECK: supple, thyroid normal size, non-tender, without nodularity LYMPH:  no palpable lymphadenopathy in the cervical, axillary  LUNGS: clear to auscultation and percussion with normal breathing effort HEART: regular rate & rhythm and no murmurs and no lower extremity edema ABDOMEN:abdomen soft, non-tender and normal bowel sounds Musculoskeletal:no cyanosis of digits and no clubbing  NEURO: alert & oriented x 3 with fluent speech, no focal motor/sensory deficits  LABORATORY DATA:  I have reviewed the data as listed    Latest Ref Rng & Units 06/07/2023    8:28 AM 05/10/2023    1:27 PM 02/05/2023    3:58 PM  CBC  WBC 4.0 - 10.5 K/uL 4.6  4.7  9.0   Hemoglobin 12.0 - 15.0 g/dL 78.2  95.6  21.3   Hematocrit 36.0 - 46.0 % 44.5  43.0  39.8   Platelets 150 - 400 K/uL 265  261  268         Latest Ref Rng & Units 06/07/2023    8:28 AM  05/10/2023    1:27 PM 02/05/2023    3:58 PM  CMP  Glucose 70 - 99 mg/dL 94  79  94   BUN 6 - 20 mg/dL 15  15  12    Creatinine 0.44 - 1.00 mg/dL 9.60  4.54  0.98   Sodium 135 - 145 mmol/L 139  137  141   Potassium 3.5 - 5.1 mmol/L 3.9  3.6  3.5   Chloride 98 - 111 mmol/L 104  102  105   CO2 22 - 32 mmol/L 27  25  28    Calcium 8.9 - 10.3 mg/dL 11.9  9.6  9.1   Total Protein 6.5 - 8.1 g/dL 7.6  7.7  6.5   Total Bilirubin <1.2 mg/dL 0.7  1.0  0.4   Alkaline Phos 38 - 126 U/L 80  65  67   AST 15 - 41 U/L 19  16  13    ALT 0 - 44 U/L 21  15  15        RADIOGRAPHIC STUDIES: I have personally reviewed the radiological images as listed and agreed with the findings in the report. No results found.    No orders of the defined types were placed in this encounter.  All questions were answered. The patient knows to call the clinic with any problems, questions or concerns. No barriers to learning was  detected. The total time spent in the appointment was 25 minutes.     Malachy Mood, MD 06/07/2023

## 2023-07-16 ENCOUNTER — Encounter: Payer: Self-pay | Admitting: Hematology

## 2023-08-30 ENCOUNTER — Inpatient Hospital Stay: Payer: 59 | Attending: Hematology

## 2023-08-30 ENCOUNTER — Encounter: Payer: Self-pay | Admitting: Hematology

## 2023-08-30 DIAGNOSIS — K922 Gastrointestinal hemorrhage, unspecified: Secondary | ICD-10-CM | POA: Diagnosis not present

## 2023-08-30 DIAGNOSIS — D500 Iron deficiency anemia secondary to blood loss (chronic): Secondary | ICD-10-CM | POA: Diagnosis present

## 2023-08-30 DIAGNOSIS — Z79899 Other long term (current) drug therapy: Secondary | ICD-10-CM | POA: Diagnosis not present

## 2023-08-30 DIAGNOSIS — D649 Anemia, unspecified: Secondary | ICD-10-CM

## 2023-08-30 LAB — CBC WITH DIFFERENTIAL (CANCER CENTER ONLY)
Abs Immature Granulocytes: 0.01 10*3/uL (ref 0.00–0.07)
Basophils Absolute: 0.1 10*3/uL (ref 0.0–0.1)
Basophils Relative: 1 %
Eosinophils Absolute: 0.2 10*3/uL (ref 0.0–0.5)
Eosinophils Relative: 5 %
HCT: 42 % (ref 36.0–46.0)
Hemoglobin: 14 g/dL (ref 12.0–15.0)
Immature Granulocytes: 0 %
Lymphocytes Relative: 35 %
Lymphs Abs: 1.6 10*3/uL (ref 0.7–4.0)
MCH: 29.2 pg (ref 26.0–34.0)
MCHC: 33.3 g/dL (ref 30.0–36.0)
MCV: 87.7 fL (ref 80.0–100.0)
Monocytes Absolute: 0.4 10*3/uL (ref 0.1–1.0)
Monocytes Relative: 10 %
Neutro Abs: 2.2 10*3/uL (ref 1.7–7.7)
Neutrophils Relative %: 49 %
Platelet Count: 270 10*3/uL (ref 150–400)
RBC: 4.79 MIL/uL (ref 3.87–5.11)
RDW: 12.8 % (ref 11.5–15.5)
WBC Count: 4.5 10*3/uL (ref 4.0–10.5)
nRBC: 0 % (ref 0.0–0.2)

## 2023-08-30 LAB — CMP (CANCER CENTER ONLY)
ALT: 18 U/L (ref 0–44)
AST: 18 U/L (ref 15–41)
Albumin: 4.3 g/dL (ref 3.5–5.0)
Alkaline Phosphatase: 83 U/L (ref 38–126)
Anion gap: 4 — ABNORMAL LOW (ref 5–15)
BUN: 22 mg/dL — ABNORMAL HIGH (ref 6–20)
CO2: 30 mmol/L (ref 22–32)
Calcium: 9.7 mg/dL (ref 8.9–10.3)
Chloride: 106 mmol/L (ref 98–111)
Creatinine: 1.01 mg/dL — ABNORMAL HIGH (ref 0.44–1.00)
GFR, Estimated: >60 mL/min (ref >60)
Glucose, Bld: 65 mg/dL — ABNORMAL LOW (ref 70–99)
Potassium: 3.9 mmol/L (ref 3.5–5.1)
Sodium: 140 mmol/L (ref 135–145)
Total Bilirubin: 0.5 mg/dL (ref 0.0–1.2)
Total Protein: 7.2 g/dL (ref 6.5–8.1)

## 2023-08-30 LAB — IRON AND IRON BINDING CAPACITY (CC-WL,HP ONLY)
Iron: 65 ug/dL (ref 28–170)
Saturation Ratios: 18 % (ref 10.4–31.8)
TIBC: 372 ug/dL (ref 250–450)
UIBC: 307 ug/dL (ref 148–442)

## 2023-08-30 LAB — FERRITIN: Ferritin: 30 ng/mL (ref 11–307)

## 2023-08-31 ENCOUNTER — Telehealth: Payer: Self-pay

## 2023-08-31 ENCOUNTER — Other Ambulatory Visit: Payer: Self-pay

## 2023-08-31 NOTE — Telephone Encounter (Signed)
Dr. Mosetta Putt and Olegario Shearer, patient will be scheduled as soon as possible.  Auth Submission: NO AUTH NEEDED Site of care: Site of care: [facility] Payer: [employer] state health plan Medication & CPT/J Code(s) submitted: Venofer (Iron Sucrose) J1756 Route of submission (phone, fax, portal):  Phone # Fax # Auth type: Buy/Bill PB Units/visits requested: 300mg  x 3 doses Reference number:  Approval from: 08/31/23 to 02/28/24

## 2023-09-06 ENCOUNTER — Other Ambulatory Visit: Payer: Self-pay | Admitting: Nurse Practitioner

## 2023-09-06 ENCOUNTER — Telehealth: Payer: Self-pay

## 2023-09-06 NOTE — Telephone Encounter (Addendum)
Called patient to relay message below no answer LM to call us back.   ----- Message from Malachy Mood sent at 08/30/2023 11:13 PM EST ----- Please let pt know her ferritin is below the goal, and I recommend Iv venofer 300-400mg  please arrange, thx   Malachy Mood

## 2023-09-07 ENCOUNTER — Other Ambulatory Visit: Payer: Self-pay

## 2023-09-07 IMAGING — MR MR HEAD WO/W CM
6 of 12 series · 26 of 48 positions shown · IV contrast (gadavist)
Comparison: CT November 17, 2021.

CLINICAL DATA: Headache, new or worsening (Age >= 50y)

EXAM:
MRI HEAD WITHOUT AND WITH CONTRAST
TECHNIQUE: Multiplanar, multiecho pulse sequences of the brain and surrounding
structures were obtained without and with intravenous contrast.
CONTRAST:  8mL GADAVIST GADOBUTROL 1 MMOL/ML IV SOLN

[Series 2: DWI · axial · 3.0mm · 0.94mm/px · z∈[-75,+72]mm · 8 of 100 slices shown (1 of 2)]
[im 1/100]
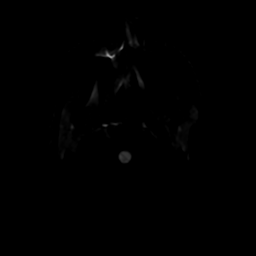
[im 15/100]
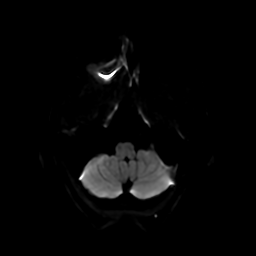
[im 29/100]
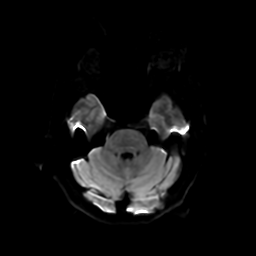
[im 43/100]
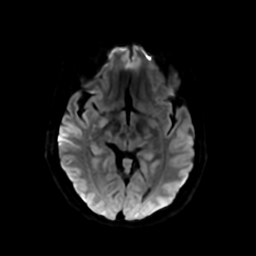
[im 57/100]
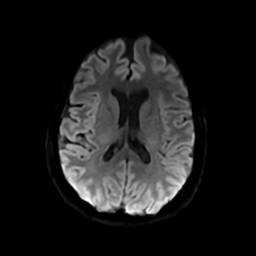
[im 71/100]
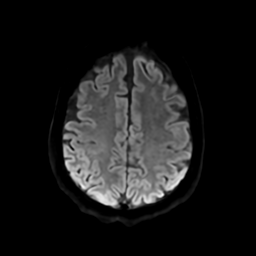
[im 85/100]
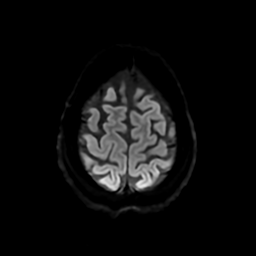
[im 100/100]
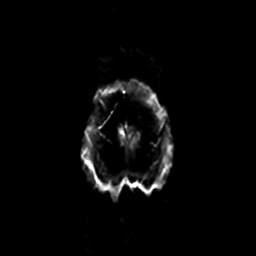

[Series 3: DWI · coronal · 4.0mm · 0.94mm/px · 6 of 73 slices shown (2 of 2)]
[im 1/73]
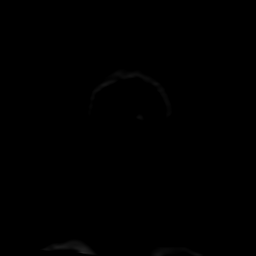
[im 15/73]
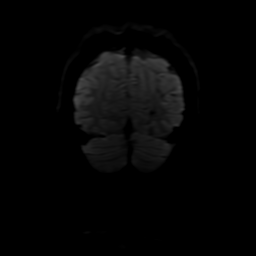
[im 29/73]
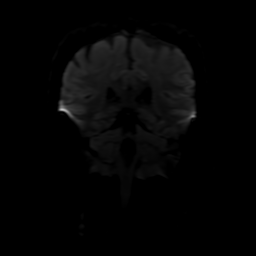
[im 44/73]
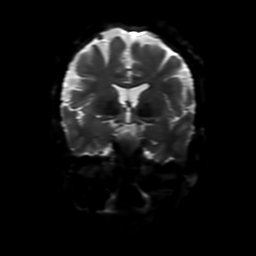
[im 58/73]
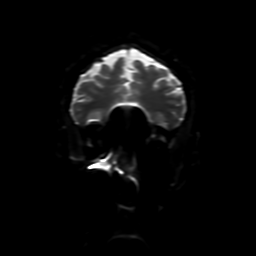
[im 73/73]
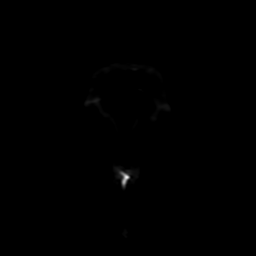

[Series 4: FLAIR · sagittal · 5.0mm · 0.23mm/px · 2 of 24 slices shown (1 of 2)]
[im 1/24]
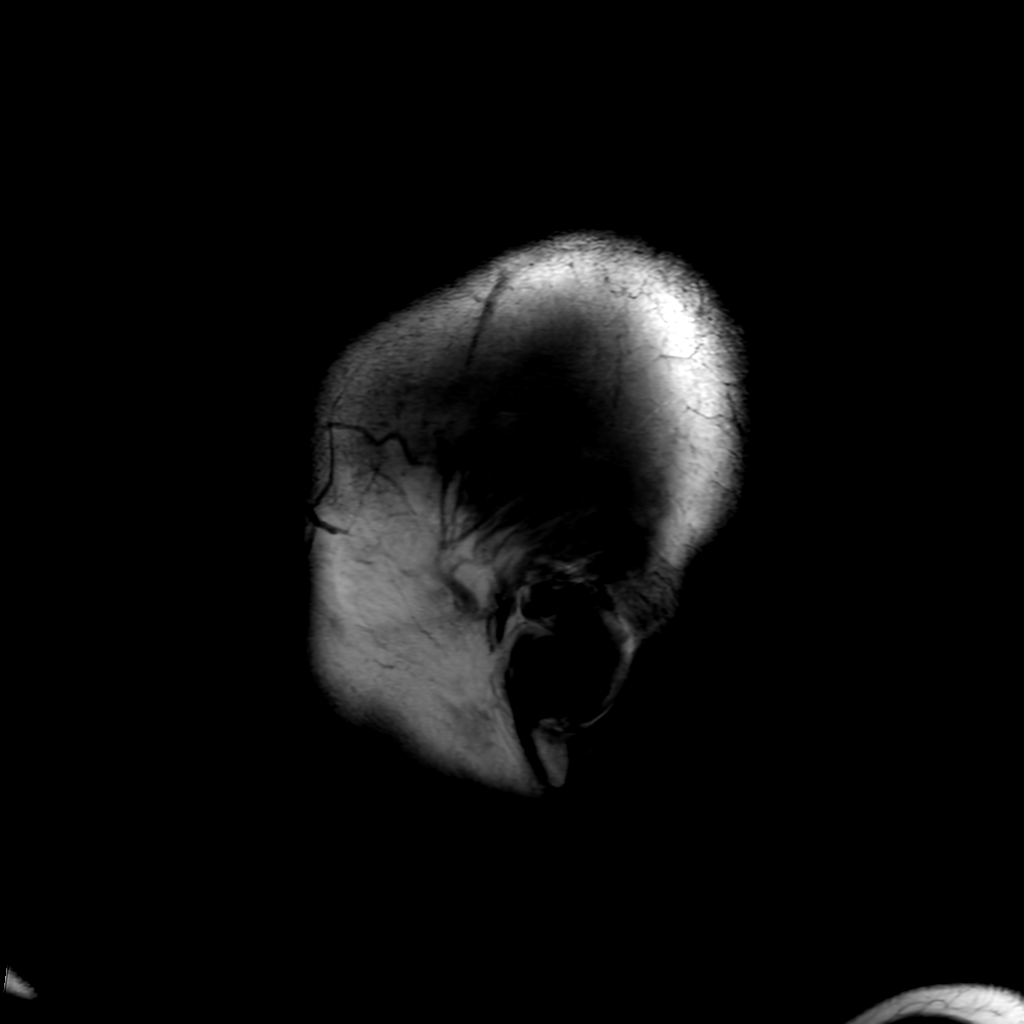
[im 24/24]
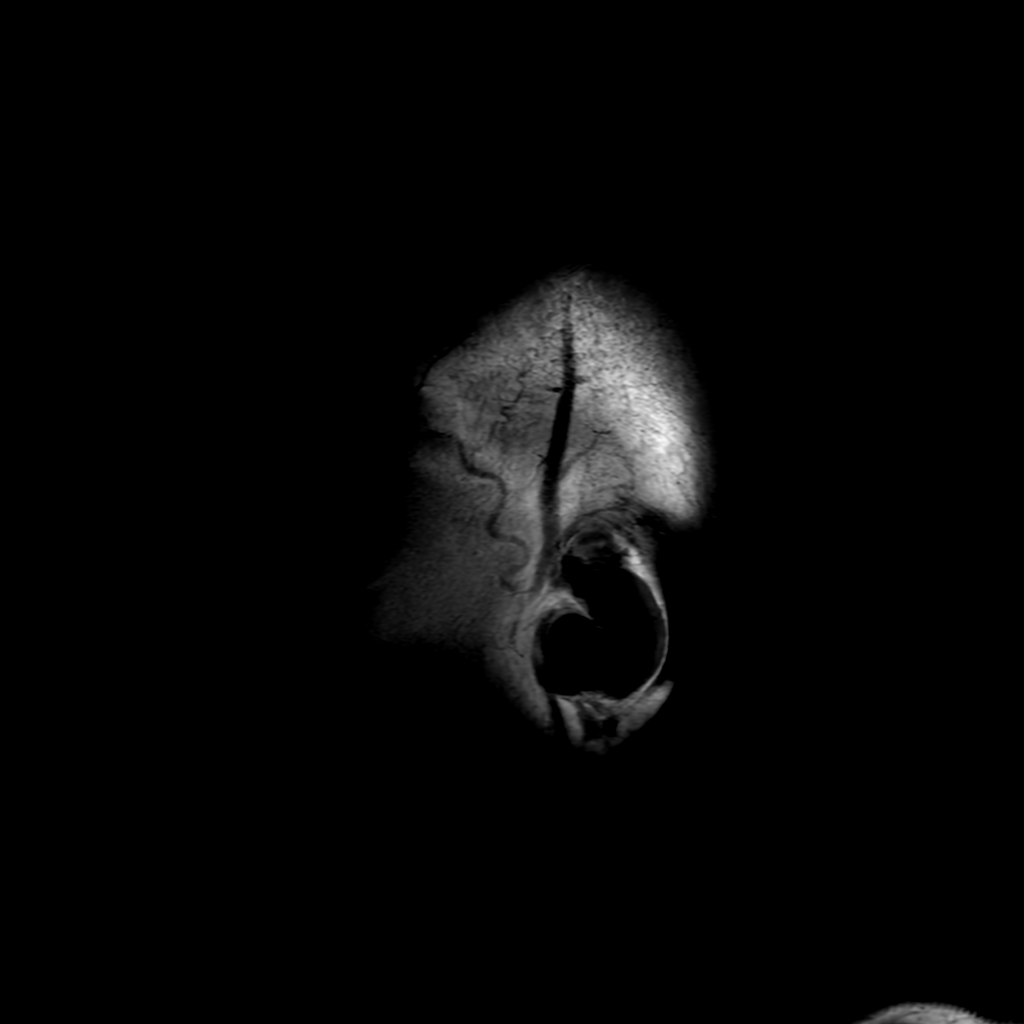

[Series 6: FLAIR · axial · 4.0mm · 0.45mm/px · z∈[-69,+80]mm · 3 of 35 slices shown (2 of 2)]
[im 1/35]
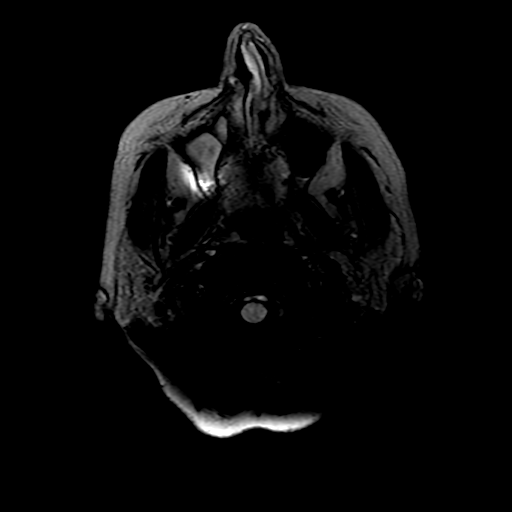
[im 18/35]
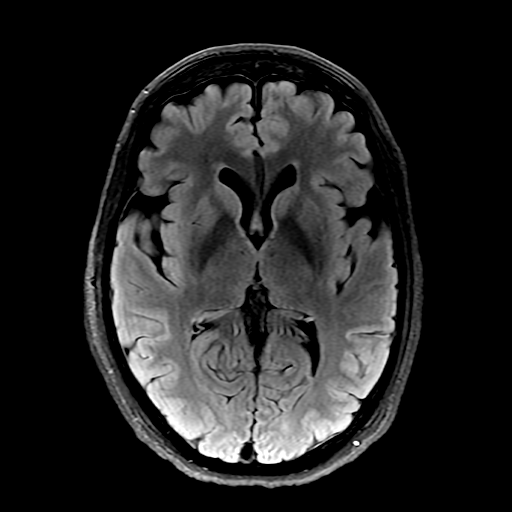
[im 35/35]
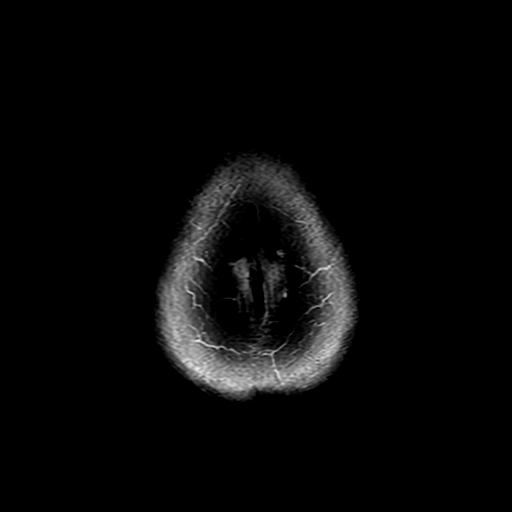

[Series 250: ADC · axial · 3.0mm · 0.94mm/px · z∈[-75,+72]mm · 4 of 50 slices shown (1 of 2)]
[im 1/50]
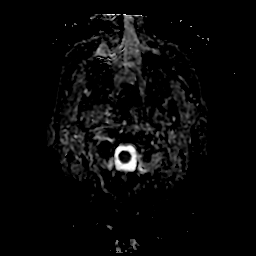
[im 17/50]
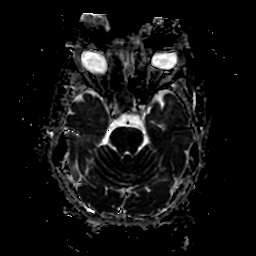
[im 33/50]
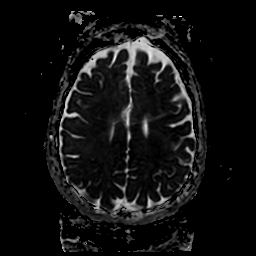
[im 50/50]
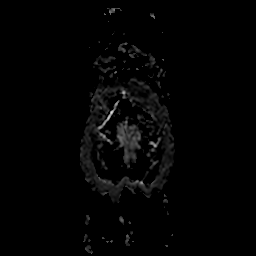

[Series 350: ADC · coronal · 4.0mm · 0.94mm/px · 3 of 36 slices shown (2 of 2)]
[im 1/36]
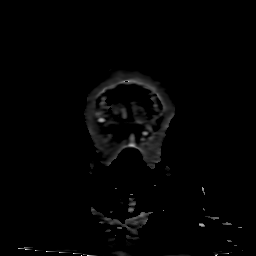
[im 18/36]
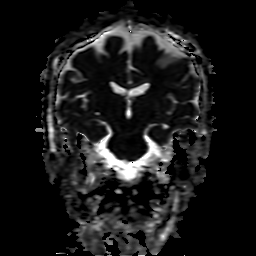
[im 36/36]
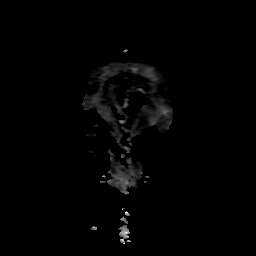

[26 of 48 positions shown; findings below may reference images not displayed]

FINDINGS: Brain: No acute infarction, hemorrhage, hydrocephalus, extra-axial
collection or mass lesion. No pathologic enhancement.

Vascular: Major arterial flow voids are maintained skull base.

Skull and upper cervical spine: Normal marrow signal.

Sinuses/Orbits: Near complete right maxillary sinus opacification.

Other: No mastoid effusions.
IMPRESSION: 1. No evidence of acute intracranial abnormality.
2. Near complete right maxillary sinus opacification. Correlate with
signs/symptoms of sinusitis.

## 2023-09-10 ENCOUNTER — Other Ambulatory Visit: Payer: Self-pay

## 2023-09-13 ENCOUNTER — Encounter: Payer: Self-pay | Admitting: Hematology

## 2023-09-14 ENCOUNTER — Telehealth: Payer: Self-pay | Admitting: Nurse Practitioner

## 2023-09-14 NOTE — Telephone Encounter (Signed)
 Called and spoke with patient 09/14/2023. States she started having symptoms associated with URI around 09/03/2023. After not getting better for several days, she did take home COVID test. This was positive. She states that initial test was expired, so she got a 2nd test from the pharmacy. The second test was also positive. States that she has been working from home and resting. She is taking B12, zyrtec, and iron. Everyday she is feeling a little better. Has noted some heaviness (congestion) in her chest. Plans to start taking some mucinex to help. She states that she will continue to rest and hydrate. I recommended that she add OTC zinc, vitamin d, and vitamin c every day. Can also use otc medications as needed and as indicated for symptom management. She voiced understanding of all instructions and will call for persistent symptoms or concerns.

## 2023-09-18 ENCOUNTER — Ambulatory Visit: Payer: 59

## 2023-09-18 VITALS — BP 128/85 | HR 65 | Temp 97.7°F | Resp 16 | Ht 67.0 in | Wt 160.0 lb

## 2023-09-18 DIAGNOSIS — D500 Iron deficiency anemia secondary to blood loss (chronic): Secondary | ICD-10-CM

## 2023-09-18 DIAGNOSIS — E2839 Other primary ovarian failure: Secondary | ICD-10-CM | POA: Diagnosis not present

## 2023-09-18 MED ORDER — IRON SUCROSE 300 MG IVPB - SIMPLE MED: 300.0000 mg | Freq: Once | Status: DC

## 2023-09-18 MED ORDER — DIPHENHYDRAMINE HCL 25 MG PO CAPS: 25.0000 mg | ORAL_CAPSULE | Freq: Once | ORAL | Status: DC

## 2023-09-18 MED ORDER — ACETAMINOPHEN 325 MG PO TABS: 650.0000 mg | ORAL_TABLET | Freq: Once | ORAL | Status: DC

## 2023-09-18 MED ORDER — SODIUM CHLORIDE 0.9 % IV SOLN
300.0000 mg | Freq: Once | INTRAVENOUS | Status: AC
  Administered 2023-09-18: 300 mg via INTRAVENOUS
  Filled 2023-09-18: qty 15

## 2023-09-18 NOTE — Progress Notes (Signed)
 Diagnosis: Iron Deficiency Anemia  Provider:  Chilton Greathouse MD  Procedure: IV Infusion  IV Type: Peripheral, IV Location: R Antecubital  Venofer (Iron Sucrose), Dose: 300 mg  Infusion Start Time: 1155  Infusion Stop Time: 1338  Post Infusion IV Care: Patient declined observation and Peripheral IV Discontinued  Discharge: Condition: Good, Destination: Home . AVS Provided  Performed by:  Wyvonne Lenz, RN

## 2023-09-19 ENCOUNTER — Encounter: Payer: Self-pay | Admitting: Hematology

## 2023-09-19 ENCOUNTER — Other Ambulatory Visit: Payer: Self-pay

## 2023-09-21 ENCOUNTER — Ambulatory Visit: Payer: 59

## 2023-09-28 ENCOUNTER — Ambulatory Visit: Payer: 59

## 2023-09-28 VITALS — BP 139/84 | HR 69 | Temp 97.7°F | Resp 18 | Ht 67.0 in | Wt 159.6 lb

## 2023-09-28 DIAGNOSIS — D500 Iron deficiency anemia secondary to blood loss (chronic): Secondary | ICD-10-CM

## 2023-09-28 DIAGNOSIS — E2839 Other primary ovarian failure: Secondary | ICD-10-CM

## 2023-09-28 MED ORDER — DIPHENHYDRAMINE HCL 25 MG PO CAPS: 25.0000 mg | ORAL_CAPSULE | Freq: Once | ORAL | Status: DC

## 2023-09-28 MED ORDER — SODIUM CHLORIDE 0.9 % IV SOLN
300.0000 mg | Freq: Once | INTRAVENOUS | Status: AC
  Administered 2023-09-28: 300 mg via INTRAVENOUS
  Filled 2023-09-28: qty 15

## 2023-09-28 MED ORDER — ACETAMINOPHEN 325 MG PO TABS
650.0000 mg | ORAL_TABLET | Freq: Once | ORAL | Status: AC
  Administered 2023-09-28: 650 mg via ORAL
  Filled 2023-09-28: qty 2

## 2023-09-28 MED ORDER — IRON SUCROSE 300 MG IVPB - SIMPLE MED: 300.0000 mg | Freq: Once | Status: DC

## 2023-09-28 NOTE — Progress Notes (Signed)
 Diagnosis: Iron Deficiency Anemia  Provider:  Chilton Greathouse MD  Procedure: IV Infusion  IV Type: Peripheral, IV Location: R Forearm  Venofer (Iron Sucrose), Dose: 300 mg  Infusion Start Time: 0945  Infusion Stop Time: 1126  Post Infusion IV Care: Pt declined observation, PIV discontinued.  Discharge: Condition: Good, Destination: Home . AVS Declined  Performed by:  Garnette Czech, RN

## 2023-10-05 ENCOUNTER — Ambulatory Visit: Payer: 59

## 2023-10-08 ENCOUNTER — Ambulatory Visit (INDEPENDENT_AMBULATORY_CARE_PROVIDER_SITE_OTHER)

## 2023-10-08 VITALS — BP 143/91 | HR 91 | Temp 97.5°F | Resp 16 | Ht 67.0 in | Wt 157.6 lb

## 2023-10-08 DIAGNOSIS — D500 Iron deficiency anemia secondary to blood loss (chronic): Secondary | ICD-10-CM | POA: Diagnosis not present

## 2023-10-08 DIAGNOSIS — E2839 Other primary ovarian failure: Secondary | ICD-10-CM | POA: Diagnosis not present

## 2023-10-08 MED ORDER — SODIUM CHLORIDE 0.9 % IV SOLN
300.0000 mg | Freq: Once | INTRAVENOUS | Status: AC
Start: 2023-10-08 — End: 2023-10-08
  Administered 2023-10-08: 300 mg via INTRAVENOUS
  Filled 2023-10-08: qty 15

## 2023-10-08 MED ORDER — DIPHENHYDRAMINE HCL 25 MG PO CAPS
25.0000 mg | ORAL_CAPSULE | Freq: Once | ORAL | Status: DC
Start: 2023-10-08 — End: 2023-10-08
  Filled 2023-10-08: qty 1

## 2023-10-08 MED ORDER — ACETAMINOPHEN 325 MG PO TABS
650.0000 mg | ORAL_TABLET | Freq: Once | ORAL | Status: DC
Start: 2023-10-08 — End: 2023-10-08
  Filled 2023-10-08: qty 2

## 2023-10-08 NOTE — Progress Notes (Signed)
 Diagnosis: Iron Deficiency Anemia  Provider:  Chilton Greathouse MD  Procedure: IV Infusion  IV Type: Peripheral, IV Location: R Antecubital  Venofer (Iron Sucrose), Dose: 300 mg  Infusion Start Time: 1336  Infusion Stop Time: 1518  Post Infusion IV Care: Patient declined observation and Peripheral IV Discontinued  Discharge: Condition: Good, Destination: Home . AVS Declined  Performed by:  Loney Hering, LPN

## 2023-11-22 ENCOUNTER — Inpatient Hospital Stay: Payer: BC Managed Care – PPO | Attending: Hematology

## 2023-11-22 ENCOUNTER — Encounter: Payer: Self-pay | Admitting: Hematology

## 2023-11-22 DIAGNOSIS — K922 Gastrointestinal hemorrhage, unspecified: Secondary | ICD-10-CM | POA: Diagnosis not present

## 2023-11-22 DIAGNOSIS — Z79899 Other long term (current) drug therapy: Secondary | ICD-10-CM | POA: Insufficient documentation

## 2023-11-22 DIAGNOSIS — D649 Anemia, unspecified: Secondary | ICD-10-CM

## 2023-11-22 DIAGNOSIS — D500 Iron deficiency anemia secondary to blood loss (chronic): Secondary | ICD-10-CM | POA: Diagnosis present

## 2023-11-22 LAB — CMP (CANCER CENTER ONLY)
ALT: 18 U/L (ref 0–44)
AST: 16 U/L (ref 15–41)
Albumin: 4.4 g/dL (ref 3.5–5.0)
Alkaline Phosphatase: 69 U/L (ref 38–126)
Anion gap: 5 (ref 5–15)
BUN: 18 mg/dL (ref 6–20)
CO2: 30 mmol/L (ref 22–32)
Calcium: 9.5 mg/dL (ref 8.9–10.3)
Chloride: 103 mmol/L (ref 98–111)
Creatinine: 0.93 mg/dL (ref 0.44–1.00)
GFR, Estimated: >60 mL/min (ref >60)
Glucose, Bld: 87 mg/dL (ref 70–99)
Potassium: 4 mmol/L (ref 3.5–5.1)
Sodium: 138 mmol/L (ref 135–145)
Total Bilirubin: 0.5 mg/dL (ref 0.0–1.2)
Total Protein: 7.2 g/dL (ref 6.5–8.1)

## 2023-11-22 LAB — IRON AND IRON BINDING CAPACITY (CC-WL,HP ONLY)
Iron: 74 ug/dL (ref 28–170)
Saturation Ratios: 24 % (ref 10.4–31.8)
TIBC: 315 ug/dL (ref 250–450)
UIBC: 241 ug/dL (ref 148–442)

## 2023-11-22 LAB — CBC WITH DIFFERENTIAL (CANCER CENTER ONLY)
Abs Immature Granulocytes: 0.01 10*3/uL (ref 0.00–0.07)
Basophils Absolute: 0.1 10*3/uL (ref 0.0–0.1)
Basophils Relative: 1 %
Eosinophils Absolute: 0.1 10*3/uL (ref 0.0–0.5)
Eosinophils Relative: 4 %
HCT: 41.2 % (ref 36.0–46.0)
Hemoglobin: 14.1 g/dL (ref 12.0–15.0)
Immature Granulocytes: 0 %
Lymphocytes Relative: 40 %
Lymphs Abs: 1.5 10*3/uL (ref 0.7–4.0)
MCH: 29.5 pg (ref 26.0–34.0)
MCHC: 34.2 g/dL (ref 30.0–36.0)
MCV: 86.2 fL (ref 80.0–100.0)
Monocytes Absolute: 0.4 10*3/uL (ref 0.1–1.0)
Monocytes Relative: 10 %
Neutro Abs: 1.7 10*3/uL (ref 1.7–7.7)
Neutrophils Relative %: 45 %
Platelet Count: 249 10*3/uL (ref 150–400)
RBC: 4.78 MIL/uL (ref 3.87–5.11)
RDW: 13.1 % (ref 11.5–15.5)
WBC Count: 3.8 10*3/uL — ABNORMAL LOW (ref 4.0–10.5)
nRBC: 0 % (ref 0.0–0.2)

## 2023-11-22 LAB — FERRITIN: Ferritin: 259 ng/mL (ref 11–307)

## 2023-11-23 ENCOUNTER — Encounter: Payer: Self-pay | Admitting: Hematology

## 2023-11-23 ENCOUNTER — Other Ambulatory Visit: Payer: Self-pay | Admitting: Hematology

## 2023-11-26 ENCOUNTER — Other Ambulatory Visit: Payer: Self-pay | Admitting: Nurse Practitioner

## 2023-11-26 DIAGNOSIS — D500 Iron deficiency anemia secondary to blood loss (chronic): Secondary | ICD-10-CM

## 2023-11-27 ENCOUNTER — Telehealth: Payer: Self-pay | Admitting: Nurse Practitioner

## 2023-11-28 ENCOUNTER — Encounter: Payer: Self-pay | Admitting: Hematology

## 2023-11-28 ENCOUNTER — Inpatient Hospital Stay

## 2023-11-28 DIAGNOSIS — D649 Anemia, unspecified: Secondary | ICD-10-CM

## 2023-11-28 DIAGNOSIS — D500 Iron deficiency anemia secondary to blood loss (chronic): Secondary | ICD-10-CM

## 2023-11-28 LAB — IRON AND IRON BINDING CAPACITY (CC-WL,HP ONLY)
Iron: 100 ug/dL (ref 28–170)
Saturation Ratios: 32 % — ABNORMAL HIGH (ref 10.4–31.8)
TIBC: 312 ug/dL (ref 250–450)
UIBC: 212 ug/dL (ref 148–442)

## 2023-11-28 LAB — CBC WITH DIFFERENTIAL (CANCER CENTER ONLY)
Abs Immature Granulocytes: 0.01 10*3/uL (ref 0.00–0.07)
Basophils Absolute: 0.1 10*3/uL (ref 0.0–0.1)
Basophils Relative: 1 %
Eosinophils Absolute: 0.1 10*3/uL (ref 0.0–0.5)
Eosinophils Relative: 3 %
HCT: 41.7 % (ref 36.0–46.0)
Hemoglobin: 14 g/dL (ref 12.0–15.0)
Immature Granulocytes: 0 %
Lymphocytes Relative: 45 %
Lymphs Abs: 1.7 10*3/uL (ref 0.7–4.0)
MCH: 29.1 pg (ref 26.0–34.0)
MCHC: 33.6 g/dL (ref 30.0–36.0)
MCV: 86.7 fL (ref 80.0–100.0)
Monocytes Absolute: 0.3 10*3/uL (ref 0.1–1.0)
Monocytes Relative: 9 %
Neutro Abs: 1.6 10*3/uL — ABNORMAL LOW (ref 1.7–7.7)
Neutrophils Relative %: 42 %
Platelet Count: 261 10*3/uL (ref 150–400)
RBC: 4.81 MIL/uL (ref 3.87–5.11)
RDW: 13.1 % (ref 11.5–15.5)
WBC Count: 3.8 10*3/uL — ABNORMAL LOW (ref 4.0–10.5)
nRBC: 0 % (ref 0.0–0.2)

## 2023-11-28 LAB — FERRITIN: Ferritin: 241 ng/mL (ref 11–307)

## 2023-12-03 ENCOUNTER — Encounter: Payer: Self-pay | Admitting: Hematology

## 2024-01-03 ENCOUNTER — Encounter: Payer: Self-pay | Admitting: Hematology

## 2024-01-16 ENCOUNTER — Other Ambulatory Visit: Payer: Self-pay

## 2024-01-16 DIAGNOSIS — D500 Iron deficiency anemia secondary to blood loss (chronic): Secondary | ICD-10-CM

## 2024-01-16 NOTE — Progress Notes (Signed)
 Opened by mistake.

## 2024-01-31 ENCOUNTER — Other Ambulatory Visit: Payer: Self-pay | Admitting: Hematology

## 2024-01-31 DIAGNOSIS — D649 Anemia, unspecified: Secondary | ICD-10-CM

## 2024-01-31 DIAGNOSIS — D500 Iron deficiency anemia secondary to blood loss (chronic): Secondary | ICD-10-CM

## 2024-02-27 ENCOUNTER — Other Ambulatory Visit: Payer: Self-pay

## 2024-02-27 DIAGNOSIS — D500 Iron deficiency anemia secondary to blood loss (chronic): Secondary | ICD-10-CM

## 2024-02-28 ENCOUNTER — Inpatient Hospital Stay: Payer: BC Managed Care – PPO | Attending: Hematology

## 2024-02-28 DIAGNOSIS — Z79899 Other long term (current) drug therapy: Secondary | ICD-10-CM | POA: Insufficient documentation

## 2024-02-28 DIAGNOSIS — D500 Iron deficiency anemia secondary to blood loss (chronic): Secondary | ICD-10-CM | POA: Insufficient documentation

## 2024-02-28 DIAGNOSIS — K922 Gastrointestinal hemorrhage, unspecified: Secondary | ICD-10-CM | POA: Insufficient documentation

## 2024-02-28 LAB — CBC WITH DIFFERENTIAL (CANCER CENTER ONLY)
Abs Immature Granulocytes: 0.01 K/uL (ref 0.00–0.07)
Basophils Absolute: 0.1 K/uL (ref 0.0–0.1)
Basophils Relative: 1 %
Eosinophils Absolute: 0.1 K/uL (ref 0.0–0.5)
Eosinophils Relative: 3 %
HCT: 41 % (ref 36.0–46.0)
Hemoglobin: 13.8 g/dL (ref 12.0–15.0)
Immature Granulocytes: 0 %
Lymphocytes Relative: 44 %
Lymphs Abs: 1.8 K/uL (ref 0.7–4.0)
MCH: 29.9 pg (ref 26.0–34.0)
MCHC: 33.7 g/dL (ref 30.0–36.0)
MCV: 88.9 fL (ref 80.0–100.0)
Monocytes Absolute: 0.4 K/uL (ref 0.1–1.0)
Monocytes Relative: 10 %
Neutro Abs: 1.8 K/uL (ref 1.7–7.7)
Neutrophils Relative %: 42 %
Platelet Count: 255 K/uL (ref 150–400)
RBC: 4.61 MIL/uL (ref 3.87–5.11)
RDW: 12.9 % (ref 11.5–15.5)
WBC Count: 4.2 K/uL (ref 4.0–10.5)
nRBC: 0 % (ref 0.0–0.2)

## 2024-02-28 LAB — IRON AND IRON BINDING CAPACITY (CC-WL,HP ONLY)
Iron: 104 ug/dL (ref 28–170)
Saturation Ratios: 33 % — ABNORMAL HIGH (ref 10.4–31.8)
TIBC: 314 ug/dL (ref 250–450)
UIBC: 210 ug/dL (ref 148–442)

## 2024-02-28 LAB — FERRITIN: Ferritin: 175 ng/mL (ref 11–307)

## 2024-05-05 ENCOUNTER — Encounter: Payer: Self-pay | Admitting: Hematology

## 2024-06-04 ENCOUNTER — Other Ambulatory Visit: Payer: Self-pay

## 2024-06-04 DIAGNOSIS — D500 Iron deficiency anemia secondary to blood loss (chronic): Secondary | ICD-10-CM

## 2024-06-04 NOTE — Assessment & Plan Note (Signed)
 likely GI bleeding from gastritis or diverticulitis -she initially presented to ED on 11/17/21 with head pain triggered by movement. CBC showed hgb 6.4, iron 8, and ferritin 2. Fecal occult blood test was also positive. -colonoscopy/EGD on 11/30/21 with Dr. Iona Coach at [facility] was negative for malignancy but showed 7 cm hiatal hernia, diffuse gastritis, biopsy was negative for malignancy.   -Her iron deficiency is probably related to small GI bleeding, from gastritis or possible related to hiatal hernia or diverticulitis. -she received three doses of IV Venofer in 11/2021.  Anemia resolved.

## 2024-06-05 ENCOUNTER — Inpatient Hospital Stay: Payer: BC Managed Care – PPO | Attending: Hematology | Admitting: Hematology

## 2024-06-05 ENCOUNTER — Inpatient Hospital Stay: Payer: BC Managed Care – PPO

## 2024-06-05 VITALS — BP 124/89 | HR 97 | Temp 97.5°F | Resp 16 | Wt 170.1 lb

## 2024-06-05 DIAGNOSIS — Z853 Personal history of malignant neoplasm of breast: Secondary | ICD-10-CM | POA: Insufficient documentation

## 2024-06-05 DIAGNOSIS — Z8719 Personal history of other diseases of the digestive system: Secondary | ICD-10-CM | POA: Insufficient documentation

## 2024-06-05 DIAGNOSIS — D500 Iron deficiency anemia secondary to blood loss (chronic): Secondary | ICD-10-CM

## 2024-06-05 DIAGNOSIS — Z885 Allergy status to narcotic agent status: Secondary | ICD-10-CM | POA: Insufficient documentation

## 2024-06-05 DIAGNOSIS — Z79899 Other long term (current) drug therapy: Secondary | ICD-10-CM | POA: Diagnosis not present

## 2024-06-05 DIAGNOSIS — K5732 Diverticulitis of large intestine without perforation or abscess without bleeding: Secondary | ICD-10-CM | POA: Insufficient documentation

## 2024-06-05 DIAGNOSIS — Z9049 Acquired absence of other specified parts of digestive tract: Secondary | ICD-10-CM | POA: Diagnosis not present

## 2024-06-05 LAB — CBC WITH DIFFERENTIAL (CANCER CENTER ONLY)
Abs Immature Granulocytes: 0.01 K/uL (ref 0.00–0.07)
Basophils Absolute: 0.1 K/uL (ref 0.0–0.1)
Basophils Relative: 1 %
Eosinophils Absolute: 0.1 K/uL (ref 0.0–0.5)
Eosinophils Relative: 3 %
HCT: 41 % (ref 36.0–46.0)
Hemoglobin: 14.2 g/dL (ref 12.0–15.0)
Immature Granulocytes: 0 %
Lymphocytes Relative: 44 %
Lymphs Abs: 1.9 K/uL (ref 0.7–4.0)
MCH: 29.8 pg (ref 26.0–34.0)
MCHC: 34.6 g/dL (ref 30.0–36.0)
MCV: 86.1 fL (ref 80.0–100.0)
Monocytes Absolute: 0.4 K/uL (ref 0.1–1.0)
Monocytes Relative: 10 %
Neutro Abs: 1.9 K/uL (ref 1.7–7.7)
Neutrophils Relative %: 42 %
Platelet Count: 274 K/uL (ref 150–400)
RBC: 4.76 MIL/uL (ref 3.87–5.11)
RDW: 12.6 % (ref 11.5–15.5)
WBC Count: 4.4 K/uL (ref 4.0–10.5)
nRBC: 0 % (ref 0.0–0.2)

## 2024-06-05 LAB — IRON AND IRON BINDING CAPACITY (CC-WL,HP ONLY)
Iron: 74 ug/dL (ref 28–170)
Saturation Ratios: 21 % (ref 10.4–31.8)
TIBC: 346 ug/dL (ref 250–450)
UIBC: 272 ug/dL

## 2024-06-05 LAB — FERRITIN: Ferritin: 139 ng/mL (ref 11–307)

## 2024-06-05 NOTE — Progress Notes (Signed)
 [facility]   Telephone:(336) [phone] Fax:(336) [phone]   Clinic Follow up Note   Patient Care Team: Pcp, No as PCP - General Lanny Callander, MD as Consulting Physician (Hematology)  Date of Service:  06/05/2024  CHIEF COMPLAINT: f/u of iron  deficient anemia  CURRENT THERAPY:  IV iron  as needed if ferritin less than 50  Oncology History   Iron  deficiency anemia due to chronic blood loss likely GI bleeding from gastritis or diverticulitis -she initially presented to ED on 11/17/21 with head pain triggered by movement. CBC showed hgb 6.4, iron  8, and ferritin 2. Fecal occult blood test was also positive. -colonoscopy/EGD on 11/30/21 with Dr. Candi at [facility] was negative for malignancy but showed 7 cm hiatal hernia, diffuse gastritis, biopsy was negative for malignancy.   -Her iron  deficiency is probably related to small GI bleeding, from gastritis or possible related to hiatal hernia or diverticulitis. -she received three doses of IV Venofer  in 11/2021.  Anemia resolved.  Assessment & Plan Chronic iron  deficiency anemia Well-managed with normal blood counts. Previous low ferritin levels improved significantly post-IV iron  infusion in May. No current bleeding issues. Menopause and past tamoxifen use may contribute to anemia. Diverticulitis could cause small bleeding, but no recent episodes reported. - Continue annual follow-up for iron  levels. - Monitor for symptoms of fatigue and perform additional labs if symptoms arise.  History of breast cancer, status post surgery -Recent mammogram in [place]  using non-compression CT scan with contrast, providing peace of mind. Discussed options for mammogram with contrast enhancement and abbreviated MRI in [place], noting potential out-of-pocket costs. She prefers non-compression mammogram but will consider local options if necessary.   Plan - She is clinically doing well, CBC normal, iron  studies still pending. -We reviewed cancer  screening, she plan to continue mammogram and breast MRI in [place]  once a year. - Follow-up in 1 year with lab every 6 months     SUMMARY OF ONCOLOGIC HISTORY: Oncology History   No history exists.     Discussed the use of AI scribe software for clinical note transcription with the patient, who gave verbal consent to proceed.  History of Present Illness Emma Rios is a 58 year old female with iron  deficient anemia who presents for follow-up.  Her blood counts are normal, and she is awaiting results of her iron  studies, including ferritin levels. She received three doses of iron  in March and one dose in May of the previous year, with infusions occurring approximately once a year. She has no current bleeding, and her menstrual periods have ceased, possibly due to previous tamoxifen use.  She has a history of breast cancer in 2008. She recently had a mammogram in [place] , which included a non-compression scan and a sonogram due to dense breast tissue. The contrast used in the imaging made her feel unwell the following day.     All other systems were reviewed with the patient and are negative.  MEDICAL HISTORY:  Past Medical History:  Diagnosis Date   Breast cancer [facility])     SURGICAL HISTORY: Past Surgical History:  Procedure Laterality Date   APPENDECTOMY     CHOLECYSTECTOMY      I have reviewed the social history and family history with the patient and they are unchanged from previous note.  ALLERGIES:  is allergic to codeine.  MEDICATIONS:  Current Outpatient Medications  Medication Sig Dispense Refill   albuterol (VENTOLIN HFA) 108 (90 Base) MCG/ACT inhaler Inhale 2 puffs into the lungs every  6 (six) hours as needed for wheezing or shortness of breath.     cetirizine (ZYRTEC) 10 MG tablet Take 10 mg by mouth daily as needed for allergies or rhinitis.     pantoprazole  (PROTONIX ) 20 MG tablet Take 1 tablet (20 mg total) by mouth daily. 30 tablet 0   pseudoephedrine  (SUDAFED) 30 MG tablet Take 30 mg by mouth every 4 (four) hours as needed for congestion.     SUMAtriptan  (IMITREX ) 100 MG tablet Take 1 tablet (100 mg total) by mouth every 2 (two) hours as needed for migraine. May repeat in 2 hours if headache persists or recurs. 10 tablet 0   methylPREDNISolone  (MEDROL  DOSEPAK) 4 MG TBPK tablet Use per box directions. (Patient not taking: Reported on 06/05/2024) 21 each 0   No current facility-administered medications for this visit.    PHYSICAL EXAMINATION: ECOG PERFORMANCE STATUS: 0 - Asymptomatic  Vitals:   06/05/24 1000  BP: 124/89  Pulse: 97  Resp: 16  Temp: (!) 97.5 F (36.4 C)  SpO2: 98%   Wt Readings from Last 3 Encounters:  06/05/24 170 lb 1.6 oz (77.2 kg)  10/08/23 157 lb 9.6 oz (71.5 kg)  09/28/23 159 lb 9.6 oz (72.4 kg)     GENERAL:alert, no distress and comfortable SKIN: skin color, texture, turgor are normal, no rashes or significant lesions EYES: normal, Conjunctiva are pink and non-injected, sclera clear Musculoskeletal:no cyanosis of digits and no clubbing  NEURO: alert & oriented x 3 with fluent speech, no focal motor/sensory deficits  Physical Exam    LABORATORY DATA:  I have reviewed the data as listed    Latest Ref Rng & Units 06/05/2024   10:09 AM 02/28/2024   10:25 AM 11/28/2023   10:23 AM  CBC  WBC 4.0 - 10.5 K/uL 4.4  4.2  3.8   Hemoglobin 12.0 - 15.0 g/dL 85.7  86.1  85.9   Hematocrit 36.0 - 46.0 % 41.0  41.0  41.7   Platelets 150 - 400 K/uL 274  255  261         Latest Ref Rng & Units 11/22/2023   10:02 AM 08/30/2023   10:38 AM 06/07/2023    8:28 AM  CMP  Glucose 70 - 99 mg/dL 87  65  94   BUN 6 - 20 mg/dL 18  22  15    Creatinine 0.44 - 1.00 mg/dL 9.06  8.98  9.07   Sodium 135 - 145 mmol/L 138  140  139   Potassium 3.5 - 5.1 mmol/L 4.0  3.9  3.9   Chloride 98 - 111 mmol/L 103  106  104   CO2 22 - 32 mmol/L 30  30  27    Calcium 8.9 - 10.3 mg/dL 9.5  9.7  89.9   Total Protein 6.5 - 8.1 g/dL 7.2   7.2  7.6   Total Bilirubin 0.0 - 1.2 mg/dL 0.5  0.5  0.7   Alkaline Phos 38 - 126 U/L 69  83  80   AST 15 - 41 U/L 16  18  19    ALT 0 - 44 U/L 18  18  21        RADIOGRAPHIC STUDIES: I have personally reviewed the radiological images as listed and agreed with the findings in the report. No results found.    No orders of the defined types were placed in this encounter.  All questions were answered. The patient knows to call the clinic with any problems, questions or concerns.  No barriers to learning was detected. The total time spent in the appointment was 15 minutes, including review of chart and various tests results, discussions about plan of care and coordination of care plan     Onita Mattock, MD 06/05/2024

## 2024-06-06 ENCOUNTER — Encounter: Payer: Self-pay | Admitting: Hematology

## 2024-12-04 ENCOUNTER — Inpatient Hospital Stay: Attending: Hematology

## 2025-06-15 ENCOUNTER — Inpatient Hospital Stay: Attending: Hematology

## 2025-06-15 ENCOUNTER — Inpatient Hospital Stay: Admitting: Hematology
